# Patient Record
Sex: Male | Born: 1945 | Hispanic: No | Marital: Married | Smoking: Never smoker
Health system: Southern US, Community
[De-identification: ages and names within clinical notes are randomized; demographics above are authoritative.]

## PROBLEM LIST (undated history)

## (undated) DIAGNOSIS — E785 Hyperlipidemia, unspecified: Secondary | ICD-10-CM

## (undated) DIAGNOSIS — I639 Cerebral infarction, unspecified: Secondary | ICD-10-CM

## (undated) HISTORY — DX: Cerebral infarction, unspecified: I63.9

## (undated) HISTORY — DX: Hyperlipidemia, unspecified: E78.5

---

## 1999-09-07 ENCOUNTER — Emergency Department (HOSPITAL_COMMUNITY): Admission: EM | Admit: 1999-09-07 | Discharge: 1999-09-07 | Payer: Self-pay

## 2007-05-25 ENCOUNTER — Inpatient Hospital Stay (HOSPITAL_COMMUNITY): Admission: EM | Admit: 2007-05-25 | Discharge: 2007-05-30 | Payer: Self-pay | Admitting: Emergency Medicine

## 2007-05-25 ENCOUNTER — Encounter (INDEPENDENT_AMBULATORY_CARE_PROVIDER_SITE_OTHER): Payer: Self-pay | Admitting: Internal Medicine

## 2007-05-27 ENCOUNTER — Encounter (INDEPENDENT_AMBULATORY_CARE_PROVIDER_SITE_OTHER): Payer: Self-pay | Admitting: Internal Medicine

## 2007-05-28 ENCOUNTER — Encounter (INDEPENDENT_AMBULATORY_CARE_PROVIDER_SITE_OTHER): Payer: Self-pay | Admitting: Internal Medicine

## 2007-05-28 ENCOUNTER — Ambulatory Visit: Payer: Self-pay | Admitting: Physical Medicine & Rehabilitation

## 2007-05-30 ENCOUNTER — Inpatient Hospital Stay (HOSPITAL_COMMUNITY)
Admission: RE | Admit: 2007-05-30 | Discharge: 2007-06-12 | Payer: Self-pay | Admitting: Physical Medicine & Rehabilitation

## 2007-07-11 ENCOUNTER — Encounter
Admission: RE | Admit: 2007-07-11 | Discharge: 2007-07-11 | Payer: Self-pay | Admitting: Physical Medicine & Rehabilitation

## 2007-11-05 ENCOUNTER — Encounter
Admission: RE | Admit: 2007-11-05 | Discharge: 2008-01-13 | Payer: Self-pay | Admitting: Physical Medicine & Rehabilitation

## 2007-11-05 ENCOUNTER — Ambulatory Visit: Payer: Self-pay | Admitting: Physical Medicine & Rehabilitation

## 2007-12-31 ENCOUNTER — Ambulatory Visit: Payer: Self-pay | Admitting: Physical Medicine & Rehabilitation

## 2008-01-01 ENCOUNTER — Encounter
Admission: RE | Admit: 2008-01-01 | Discharge: 2008-03-31 | Payer: Self-pay | Admitting: Physical Medicine & Rehabilitation

## 2008-02-26 ENCOUNTER — Ambulatory Visit: Payer: Self-pay | Admitting: Physical Medicine & Rehabilitation

## 2008-05-06 ENCOUNTER — Encounter: Admission: RE | Admit: 2008-05-06 | Discharge: 2008-06-11 | Payer: Self-pay | Admitting: Neurology

## 2008-05-08 ENCOUNTER — Ambulatory Visit (HOSPITAL_COMMUNITY): Admission: RE | Admit: 2008-05-08 | Discharge: 2008-05-08 | Payer: Self-pay | Admitting: Neurology

## 2008-05-26 ENCOUNTER — Encounter
Admission: RE | Admit: 2008-05-26 | Discharge: 2008-06-18 | Payer: Self-pay | Admitting: Physical Medicine & Rehabilitation

## 2008-05-27 ENCOUNTER — Ambulatory Visit: Payer: Self-pay | Admitting: Physical Medicine & Rehabilitation

## 2008-08-24 ENCOUNTER — Encounter
Admission: RE | Admit: 2008-08-24 | Discharge: 2008-11-22 | Payer: Self-pay | Admitting: Physical Medicine & Rehabilitation

## 2008-08-26 ENCOUNTER — Ambulatory Visit: Payer: Self-pay | Admitting: Physical Medicine & Rehabilitation

## 2008-10-19 ENCOUNTER — Ambulatory Visit: Payer: Self-pay | Admitting: Physical Medicine & Rehabilitation

## 2009-03-09 ENCOUNTER — Inpatient Hospital Stay (HOSPITAL_COMMUNITY): Admission: EM | Admit: 2009-03-09 | Discharge: 2009-03-11 | Payer: Self-pay | Admitting: Emergency Medicine

## 2009-03-23 ENCOUNTER — Ambulatory Visit (HOSPITAL_COMMUNITY): Admission: RE | Admit: 2009-03-23 | Discharge: 2009-03-23 | Payer: Self-pay | Admitting: Family Medicine

## 2009-04-12 ENCOUNTER — Encounter
Admission: RE | Admit: 2009-04-12 | Discharge: 2009-06-11 | Payer: Self-pay | Admitting: Physical Medicine & Rehabilitation

## 2009-04-12 ENCOUNTER — Ambulatory Visit: Payer: Self-pay | Admitting: Physical Medicine & Rehabilitation

## 2009-06-11 ENCOUNTER — Ambulatory Visit: Payer: Self-pay | Admitting: Physical Medicine & Rehabilitation

## 2009-07-09 IMAGING — CT CT HEAD W/O CM
1 series · 15 of 30 positions shown, 19 images · non-contrast
Comparison: none

HISTORY: Altered level of consciousness

[Series 2: head routine 4.8 h47s · axial · 0.46mm/px · z∈[-208,-56]mm · 15 of 36 slices shown, 19 images]
[im 2/36  brain]
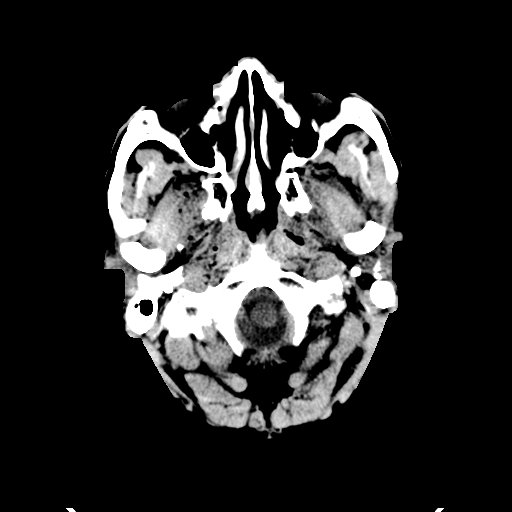
[im 2/36  bone]
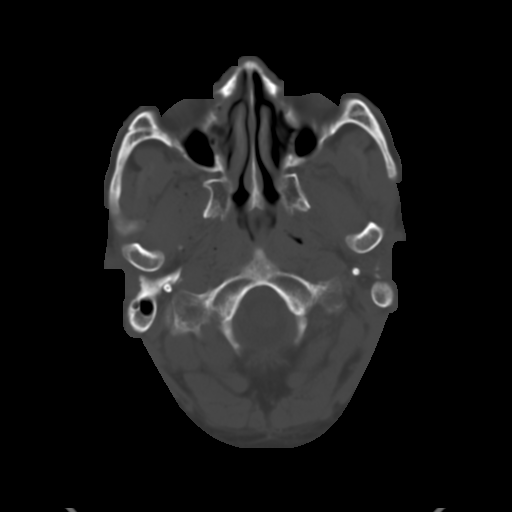
[im 4/36  brain]
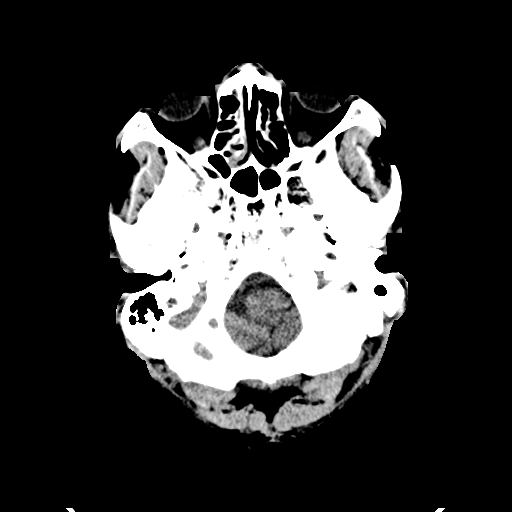
[im 7/36  brain]
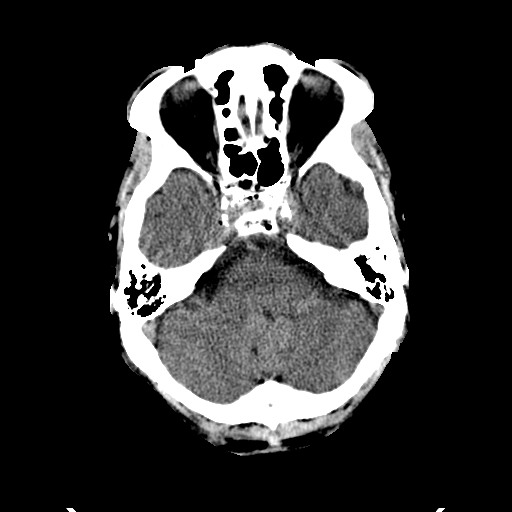
[im 9/36  brain]
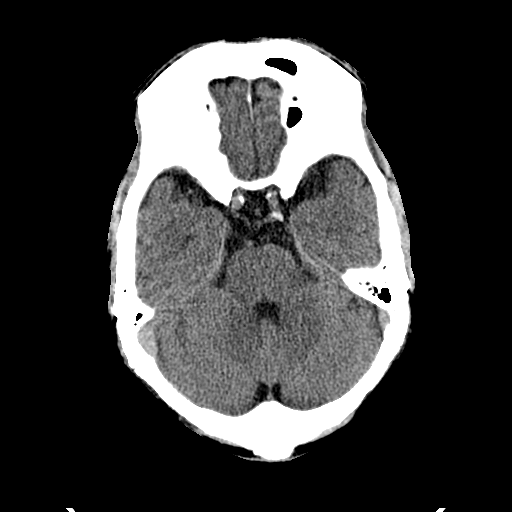
[im 11/36  brain]
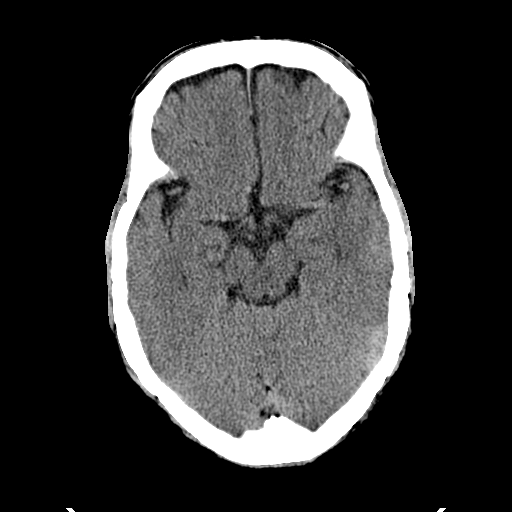
[im 11/36  bone]
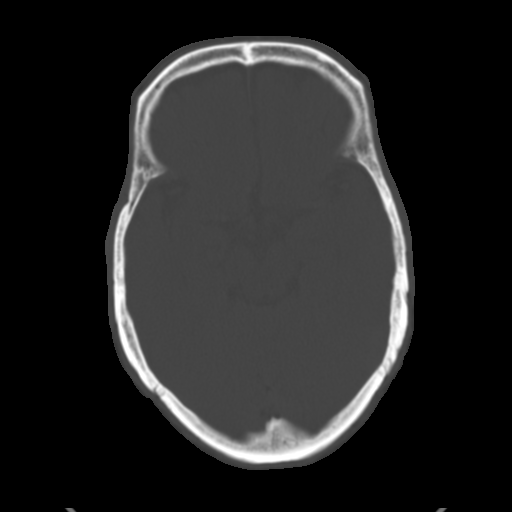
[im 14/36  brain]
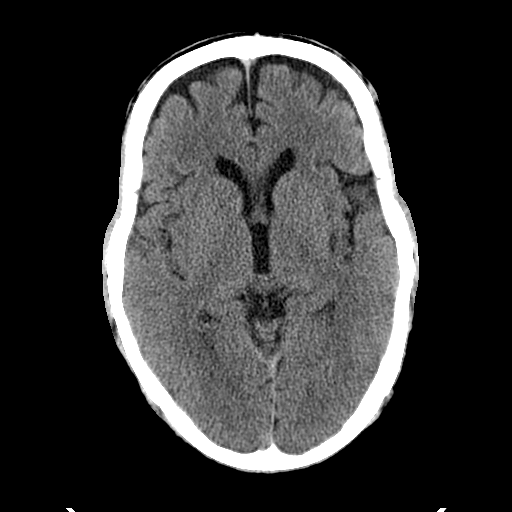
[im 16/36  brain]
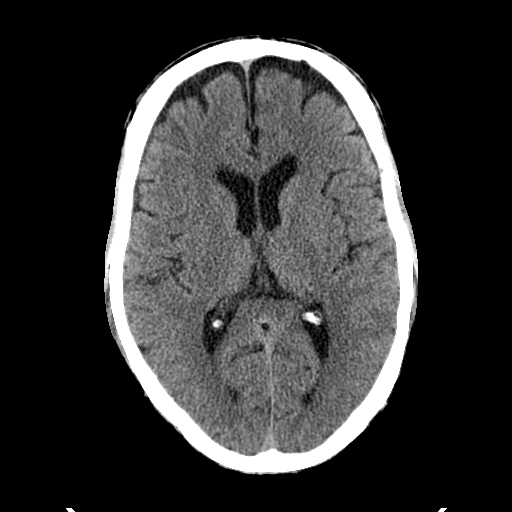
[im 19/36  brain]
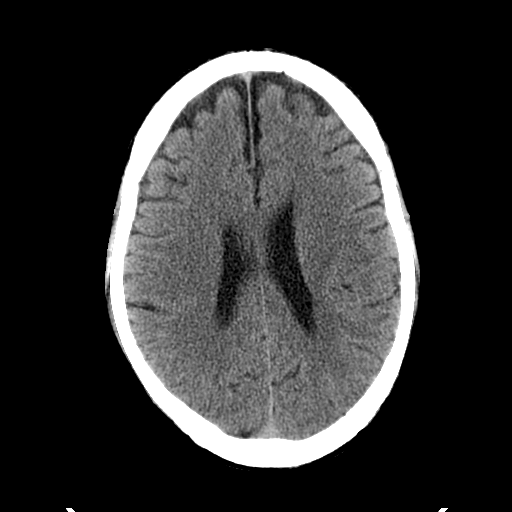
[im 20/36  brain]
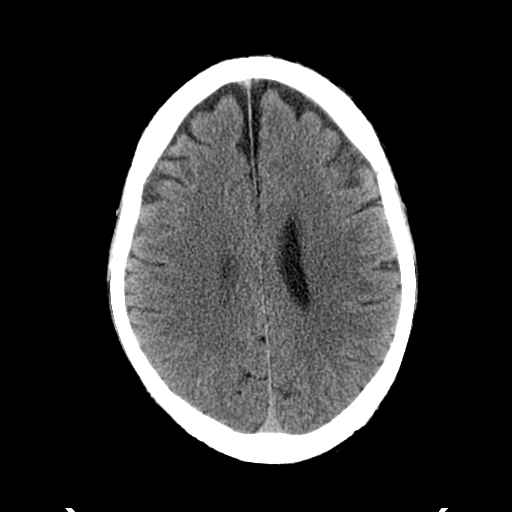
[im 20/36  bone]
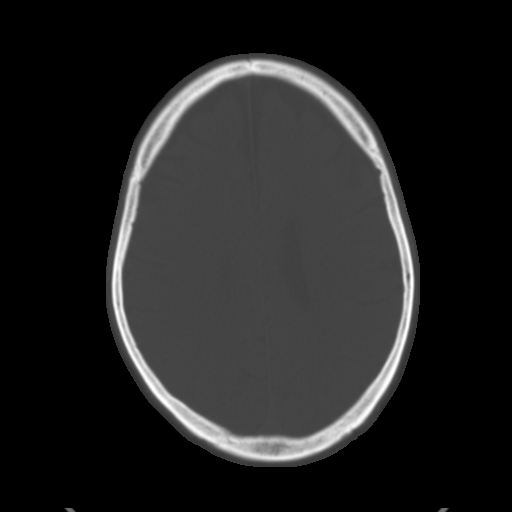
[im 22/36  brain]
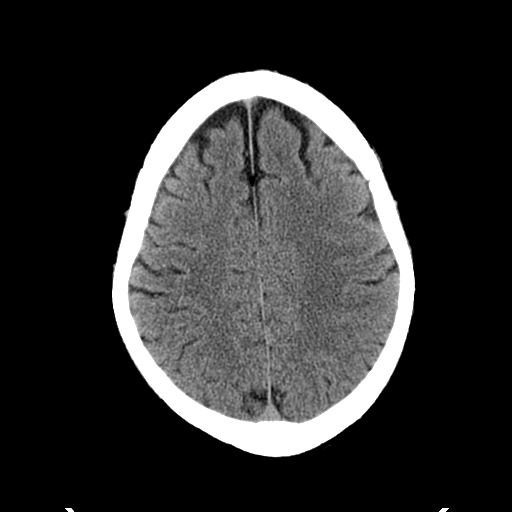
[im 25/36  brain]
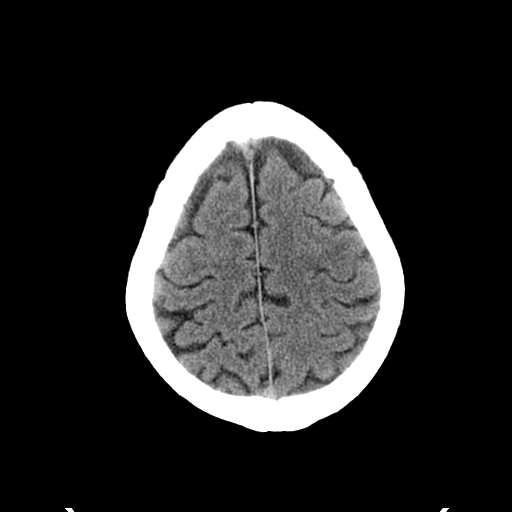
[im 27/36  brain]
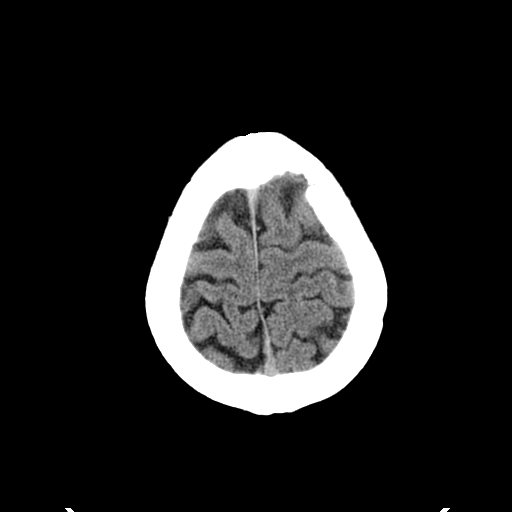
[im 29/36  brain]
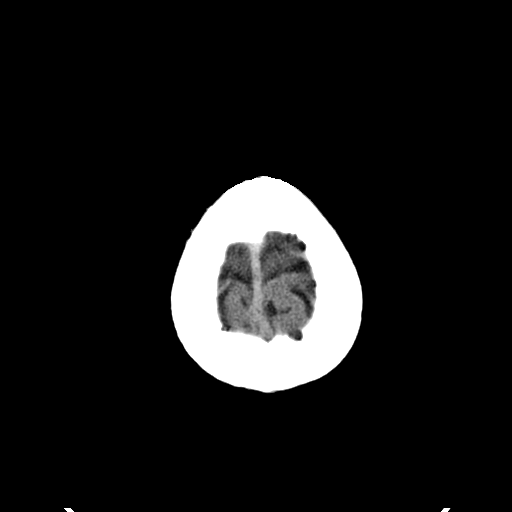
[im 29/36  bone]
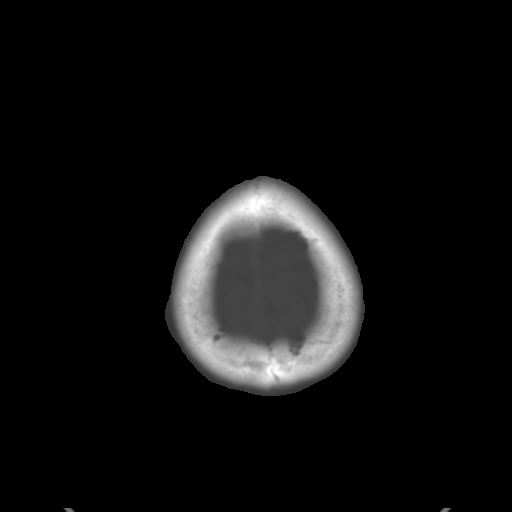
[im 32/36  brain]
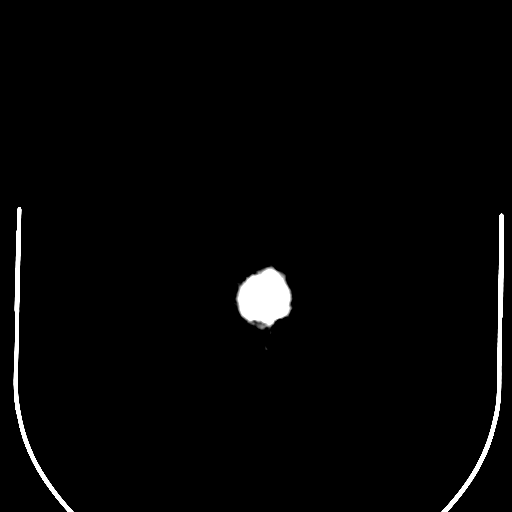
[im 34/36  brain]
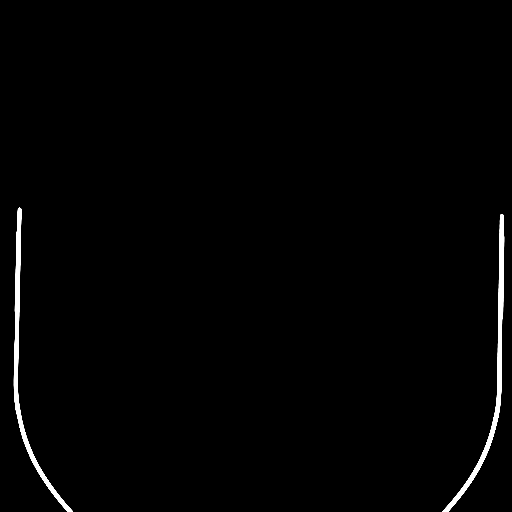

[15 of 30 positions shown; findings below may reference images not displayed]

CT HEAD WITHOUT CONTRAST:

Routine noncontrast CT head without priors for comparison.

Mild generalized atrophy.
Normal ventricular morphology.
No midline shift or mass-effect.
Otherwise normal appearance of brain parenchyma.
No intracranial hemorrhage, mass, or infarct.
Mucosal thickening throughout ethmoid air cells.

On bone window images [DATE], tiny foci of gas are seen at the skull base at the
sella turcica and parasellar regions.
These are of uncertain etiology and significance.
Adjacent sphenoid sinus is clear and no calvarial fracture is seen.
These could represent foci of gas within bone or calcification.
No CT evidence of trauma/fracture or penetrating infection/sinus disease seen.
Mastoid air cells and middle ear cavities appear clear.
IMPRESSION: Mild generalized atrophy without mass or hemorrhage.
Tiny foci of gas at skullbase at sella and parasellar regions, of uncertain
significance.
I do not see evidence of a calvarial or skull base fracture to account for gas
nor evidence of significant or penetrating sinus disease.
Correlation with patient's history recommended.
If patient's symptoms continue recommend MRI brain with and without contrast to
further assess.

## 2009-12-01 ENCOUNTER — Encounter
Admission: RE | Admit: 2009-12-01 | Discharge: 2010-03-01 | Payer: Self-pay | Admitting: Physical Medicine & Rehabilitation

## 2010-01-03 ENCOUNTER — Ambulatory Visit: Payer: Self-pay | Admitting: Physical Medicine & Rehabilitation

## 2010-04-25 ENCOUNTER — Encounter
Admission: RE | Admit: 2010-04-25 | Discharge: 2010-05-03 | Payer: Self-pay | Admitting: Physical Medicine & Rehabilitation

## 2010-05-03 ENCOUNTER — Ambulatory Visit: Payer: Self-pay | Admitting: Physical Medicine & Rehabilitation

## 2010-09-02 ENCOUNTER — Encounter
Admission: RE | Admit: 2010-09-02 | Discharge: 2010-09-07 | Payer: Self-pay | Source: Home / Self Care | Attending: Physical Medicine & Rehabilitation | Admitting: Physical Medicine & Rehabilitation

## 2010-09-07 ENCOUNTER — Ambulatory Visit: Payer: Self-pay | Admitting: Physical Medicine & Rehabilitation

## 2010-11-21 ENCOUNTER — Encounter: Payer: Self-pay | Admitting: Neurology

## 2010-12-26 ENCOUNTER — Ambulatory Visit: Payer: Medicare Other | Admitting: Physical Medicine & Rehabilitation

## 2010-12-26 ENCOUNTER — Encounter: Payer: Medicare Other | Attending: Physical Medicine & Rehabilitation

## 2010-12-26 DIAGNOSIS — M7530 Calcific tendinitis of unspecified shoulder: Secondary | ICD-10-CM

## 2010-12-26 DIAGNOSIS — I10 Essential (primary) hypertension: Secondary | ICD-10-CM

## 2010-12-26 DIAGNOSIS — IMO0002 Reserved for concepts with insufficient information to code with codable children: Secondary | ICD-10-CM

## 2010-12-26 DIAGNOSIS — M7500 Adhesive capsulitis of unspecified shoulder: Secondary | ICD-10-CM | POA: Insufficient documentation

## 2010-12-26 DIAGNOSIS — R109 Unspecified abdominal pain: Secondary | ICD-10-CM | POA: Insufficient documentation

## 2010-12-26 DIAGNOSIS — M549 Dorsalgia, unspecified: Secondary | ICD-10-CM | POA: Insufficient documentation

## 2010-12-26 DIAGNOSIS — I6330 Cerebral infarction due to thrombosis of unspecified cerebral artery: Secondary | ICD-10-CM

## 2010-12-26 DIAGNOSIS — M25539 Pain in unspecified wrist: Secondary | ICD-10-CM | POA: Insufficient documentation

## 2010-12-26 DIAGNOSIS — I69998 Other sequelae following unspecified cerebrovascular disease: Secondary | ICD-10-CM | POA: Insufficient documentation

## 2010-12-26 DIAGNOSIS — R209 Unspecified disturbances of skin sensation: Secondary | ICD-10-CM | POA: Insufficient documentation

## 2011-01-02 ENCOUNTER — Emergency Department (HOSPITAL_COMMUNITY)
Admission: EM | Admit: 2011-01-02 | Discharge: 2011-01-02 | Disposition: A | Discharge status: Home / Self Care | Payer: Medicare Other | Attending: Emergency Medicine | Admitting: Emergency Medicine

## 2011-01-02 ENCOUNTER — Emergency Department (HOSPITAL_COMMUNITY): Payer: Medicare Other

## 2011-01-02 DIAGNOSIS — J449 Chronic obstructive pulmonary disease, unspecified: Secondary | ICD-10-CM | POA: Insufficient documentation

## 2011-01-02 DIAGNOSIS — R109 Unspecified abdominal pain: Secondary | ICD-10-CM | POA: Insufficient documentation

## 2011-01-02 DIAGNOSIS — J9819 Other pulmonary collapse: Secondary | ICD-10-CM | POA: Insufficient documentation

## 2011-01-02 DIAGNOSIS — R110 Nausea: Secondary | ICD-10-CM | POA: Insufficient documentation

## 2011-01-02 DIAGNOSIS — J4489 Other specified chronic obstructive pulmonary disease: Secondary | ICD-10-CM | POA: Insufficient documentation

## 2011-01-02 LAB — DIFFERENTIAL
Basophils Absolute: 0.1 10*3/uL (ref 0.0–0.1)
Basophils Relative: 1 % (ref 0–1)
Eosinophils Absolute: 0.4 10*3/uL (ref 0.0–0.7)
Monocytes Relative: 12 % (ref 3–12)
Neutro Abs: 6.4 10*3/uL (ref 1.7–7.7)
Neutrophils Relative %: 60 % (ref 43–77)

## 2011-01-02 LAB — CBC
MCH: 28 pg (ref 26.0–34.0)
Platelets: 203 10*3/uL (ref 150–400)
RBC: 6.26 MIL/uL — ABNORMAL HIGH (ref 4.22–5.81)

## 2011-01-02 LAB — COMPREHENSIVE METABOLIC PANEL
ALT: 20 U/L (ref 0–53)
AST: 32 U/L (ref 0–37)
Albumin: 3.6 g/dL (ref 3.5–5.2)
Calcium: 9.1 mg/dL (ref 8.4–10.5)
Chloride: 106 mEq/L (ref 96–112)
Creatinine, Ser: 0.83 mg/dL (ref 0.4–1.5)
GFR calc Af Amer: >60 mL/min (ref >60)
Sodium: 137 mEq/L (ref 135–145)
Total Bilirubin: 0.5 mg/dL (ref 0.3–1.2)

## 2011-01-02 LAB — URINALYSIS, ROUTINE W REFLEX MICROSCOPIC
Bilirubin Urine: NEGATIVE
Specific Gravity, Urine: 1.019 (ref 1.005–1.030)
pH: 6 (ref 5.0–8.0)

## 2011-01-02 LAB — URINE MICROSCOPIC-ADD ON

## 2011-02-07 LAB — BASIC METABOLIC PANEL
BUN: 13 mg/dL (ref 6–23)
BUN: 8 mg/dL (ref 6–23)
BUN: 9 mg/dL (ref 6–23)
Calcium: 8.4 mg/dL (ref 8.4–10.5)
Calcium: 9 mg/dL (ref 8.4–10.5)
Creatinine, Ser: 0.86 mg/dL (ref 0.4–1.5)
Creatinine, Ser: 1.01 mg/dL (ref 0.4–1.5)
GFR calc non Af Amer: >60 mL/min (ref >60)
GFR calc non Af Amer: >60 mL/min (ref >60)
GFR calc non Af Amer: >60 mL/min (ref >60)
Glucose, Bld: 129 mg/dL — ABNORMAL HIGH (ref 70–99)
Glucose, Bld: 158 mg/dL — ABNORMAL HIGH (ref 70–99)
Potassium: 4.3 mEq/L (ref 3.5–5.1)
Sodium: 140 mEq/L (ref 135–145)

## 2011-02-07 LAB — POCT CARDIAC MARKERS
CKMB, poc: 3.6 ng/mL (ref 1.0–8.0)
CKMB, poc: 3.7 ng/mL (ref 1.0–8.0)
Troponin i, poc: <0.05 ng/mL (ref 0.00–0.09)

## 2011-02-07 LAB — DIFFERENTIAL
Basophils Relative: 0 % (ref 0–1)
Eosinophils Absolute: 0.4 10*3/uL (ref 0.0–0.7)
Eosinophils Absolute: 0.8 10*3/uL — ABNORMAL HIGH (ref 0.0–0.7)
Eosinophils Relative: 4 % (ref 0–5)
Lymphocytes Relative: 28 % (ref 12–46)
Lymphs Abs: 2.5 10*3/uL (ref 0.7–4.0)
Lymphs Abs: 3.8 10*3/uL (ref 0.7–4.0)
Neutrophils Relative %: 56 % (ref 43–77)
Neutrophils Relative %: 70 % (ref 43–77)

## 2011-02-07 LAB — CBC
HCT: 46.4 % (ref 39.0–52.0)
HCT: 46.5 % (ref 39.0–52.0)
Hemoglobin: 14.9 g/dL (ref 13.0–17.0)
Hemoglobin: 15.7 g/dL (ref 13.0–17.0)
MCV: 84.3 fL (ref 78.0–100.0)
MCV: 85.1 fL (ref 78.0–100.0)
Platelets: 188 10*3/uL (ref 150–400)
Platelets: 191 10*3/uL (ref 150–400)
Platelets: 206 10*3/uL (ref 150–400)
Platelets: 226 10*3/uL (ref 150–400)
RBC: 5.25 MIL/uL (ref 4.22–5.81)
RBC: 5.52 MIL/uL (ref 4.22–5.81)
RDW: 16.1 % — ABNORMAL HIGH (ref 11.5–15.5)
RDW: 16.4 % — ABNORMAL HIGH (ref 11.5–15.5)
WBC: 10.8 10*3/uL — ABNORMAL HIGH (ref 4.0–10.5)
WBC: 13.4 10*3/uL — ABNORMAL HIGH (ref 4.0–10.5)

## 2011-02-07 LAB — BRAIN NATRIURETIC PEPTIDE: Pro B Natriuretic peptide (BNP): <30 pg/mL (ref 0.0–100.0)

## 2011-02-07 LAB — HEMOGLOBIN A1C: Hgb A1c MFr Bld: 5.9 % (ref 4.6–6.1)

## 2011-02-07 LAB — CULTURE, BLOOD (ROUTINE X 2): Culture: NO GROWTH

## 2011-02-07 LAB — D-DIMER, QUANTITATIVE: D-Dimer, Quant: 0.72 ug/mL-FEU — ABNORMAL HIGH (ref 0.00–0.48)

## 2011-02-07 LAB — COMPREHENSIVE METABOLIC PANEL
BUN: 5 mg/dL — ABNORMAL LOW (ref 6–23)
CO2: 25 mEq/L (ref 19–32)
Chloride: 109 mEq/L (ref 96–112)
Creatinine, Ser: 0.84 mg/dL (ref 0.4–1.5)
GFR calc non Af Amer: >60 mL/min (ref >60)
Glucose, Bld: 109 mg/dL — ABNORMAL HIGH (ref 70–99)
Total Bilirubin: 0.5 mg/dL (ref 0.3–1.2)

## 2011-02-07 LAB — MAGNESIUM: Magnesium: 2.4 mg/dL (ref 1.5–2.5)

## 2011-03-14 NOTE — H&P (Signed)
NAMESHERRELL, FARISH NO.:  [id number]   MEDICAL RECORD NO.:  [id number]          PATIENT TYPE:  IPS   LOCATION:  NA                           FACILITY:  [facility]   PHYSICIAN:  Ellwood Dense, M.D.   DATE OF BIRTH:  January 02, 1946   DATE OF ADMISSION:  DATE OF DISCHARGE:                              HISTORY & PHYSICAL   PRIMARY CARE PHYSICIAN:  [facility].   NEUROLOGISTS:  Pramod P. Pearlean Brownie, MD., and Hospitalist D Team.   HISTORY OF PRESENT ILLNESS:  Mr. Spickler is a 65 year old right-handed  [place] male admitted May 25, 2007, with altered speech along with  balance problems and visual changes.  MRI and MR study of the brain  showed an acute left posterior cerebral artery infarct with questionable  thrombus in the distal middle cerebral artery branch and high grade  stenosis or occlusion in the posterior left middle cerebral artery.  Carotid Doppler showed no internal carotid artery stenosis.  Transesophageal echocardiogram showed injection of 60% with a patent  foramen ovale with right-to-left shunt, bilateral lower extremity  Dopplers were negative for DVT.   The patient is experiencing expressive more than receptive aphasia.  His  verbal output is improving.  He has decreased sensation of the right  side and is ataxic.  PT and OT are noting this patient requires total  assist to keep the right leg blocked and the right leg positioned.  He  has ambulated 3-16 feet overall.  He requires minimal to max assist for  ADLs with fluctuating ability to follow commands secondary to apraxia.  He has problems with [place] as well as [place] language at present.   The patient was evaluated by the rehabilitation physicians and felt to  be an appropriate candidate for inpatient rehabilitation.   ALLERGIES:  NO KNOWN DRUG ALLERGIES.   MEDICATIONS PRIOR TO ADMISSION:  None.   REVIEW OF SYSTEMS:  Noncontributory.   PAST MEDICAL HISTORY:  Negative for prior illness or  surgery.   FAMILY HISTORY:  Negative.   SOCIAL HISTORY:  The patient is married and works in Set designer.  He  and his wife live in a one level home with three steps to enter.  He  quit tobacco six months ago.  He reports occasional alcohol usage,  specifically beer.  His family is supportive and his wife can provide  supervision at discharge.  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   FUNCTIONAL HISTORY PRIOR TO ADMISSION:  Independent.   LABORATORY DATA:  Recent hemoglobin was 14.9 with hematocrit of 44.8,  platelet count 207,000, white count 7.6.  Homocystine level was 13.6  with TSH of 1.765.  Hemoglobin A1c was 5.7.  Urine drug screen was  nondetectable for toxins.   Chest x-ray May 25, 2007, showed minimal bronchiectatic changes along  with right basilar atelectasis.  Recent sodium was 138, potassium 4.1,  chloride 111, CO2 24, BUN 10, creatinine 0.72 and glucose 101.   PHYSICAL EXAMINATION:  VITAL SIGNS:  Blood pressure 122/71 with pulse  55, respiratory rate 18, and temperature 98.3.  GENERAL APPEARANCE:  A well-appearing, thin middle-aged adult male lying  in bed in no acute discomfort.  HEENT:  Normocephalic and atraumatic.  LUNGS:  Clear to auscultation bilaterally.  CARDIOVASCULAR:  Regular rate and rhythm.  S1 and S2 without murmurs.  ABDOMEN:  Soft, nontender with positive bowel sounds.  NEUROLOGIC:  Alert and oriented to self.  Social language is intact but  he is unable to state his name or identify items.  He follows one step  commands with some cueing.  Bilateral upper extremity exam showed 4-5  strength throughout.  Bulk and tone were normal.  Reflexes were 2+ and  symmetric.  LOWER EXTREMITY:  Exam showed 4/5 strength in hip flexion, knee  extension and ankle dorsiflexion.   IMPRESSION:  1. 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  2. Recent discovery of patent foramen ovale  on aspirin therapy with      follow-up treatment to be recommended.   Presently, the patient has deficits in activities of daily living,  transfers, ambulation and speech and higher level cognition related to  the above noted posterior cerebral artery infarct.   PLAN:  Admit to the rehabilitation unit for daily therapy to include  1. Physical therapy for range of motion, strengthening, bed mobility,      transfers, pregait training, gait training and equipment      evaluation.  2. Occupational therapy for range of motion, strengthening, ADLs,      cognitive/perceptual training, splinting and equipment evaluation.  3. Rehab nursing for skin care, wound care and bowel and bladder      training as necessary.  4. Speech therapy for oromotor exercises, communication aids and      aphasia with treatment as necessary.  5. Case management to assess home environment, assist with discharge      planning and arrange for appropriate follow-up care.  6. Social worker to assess family and social support and assist in      discharge planning along with counsel patient regarding disability      issues.  7. Continue enteric coated aspirin 325 mg p.o. daily.  8. Lovenox 40 mg subcu daily for DVT prophylaxis.  9. Protonix 40 mg p.o. daily.  10.Regular diet.  11.Check CBC along with CMET and lipid panel in the a.m. May 31, 2007.  12.Ground pass with staff and family by wheelchair.  13.Case management to assess setting up patient with primary care      physician at discharge.  14.Check urinalysis and urine culture.   PROGNOSIS:  Good.   ESTIMATED LENGTH OF STAY:  Five to 10 days.   GOALS:  Modified independent, ADLs, transfers and ambulation.           ______________________________  Ellwood Dense, M.D.     DC/MEDQ  D:  05/30/2007  T:  05/30/2007  Job:  [id number]

## 2011-03-14 NOTE — Assessment & Plan Note (Signed)
HISTORY:  [name] is back regarding a left PCA stroke.  He was  discharged from rehab in August.  He is back today for followup.  His  family member that was with him today stated that he was unable to get  transportation to the office unfortunately.  He has received home health  therapies and has completed these over the last month or so.  His main  issue today is pain in the right arm and leg which he calls stabbing.  He states that his sensation is not the same, right to left.  He is  moving the arm and leg pretty well.  He walks with a straight cane for  balance.  He has had no falls.  He has not seen neurology as of yet for  followup as was recommended, although he did see his family physician,  Dr. Wynelle Link.  The patient remains on aspirin and simvastatin for stroke  maintenance.   REVIEW OF SYSTEMS:  Pertinent and positives as listed above and full  review of written health and history in the chart.   SOCIAL HISTORY:  The patient is married with his wife today.   PHYSICAL EXAMINATION:  VITAL SIGNS:  Blood pressure 162/98, pulse 21,  respiratory rate 18, satting 94% on room air.  GENERAL:  The patient is alert and pleasant.  He is able to follow  commands.  He does speak minimal English and was communicating through  his family member today.  The patient walks with some antalgia to the  right.  He does bear weight well overall, however, it is a bit choppy  though with his movement.  He was not at a risk for falling.  He walked  without his cane today.  EXTREMITIES:  Strength in the right leg was 4+ to 5/5.  Right upper  extremity was 3+ to 4/5.  He is a bit tight in the right shoulder with  abduction and in rotator cuff maneuvers.  They seem to provoke some  pain.  I saw some under-riding pectoralis major tightness in his  shoulder today.  Otherwise, tone was trace out of 4.  Sensation was 1/2  on the right and 2/2 on the left.  NEUROLOGIC:  Cognition appeared to be appropriate,  although there was  some language there.  HEART:  Regular.  CHEST:  Clear.  ABDOMEN:  Soft, nontender.   ASSESSMENT:  1. Left posterior cerebral artery infarct.  2. Dyslipidemia.  3. Patent foramen ovale with right to left shunt.   PLAN:  1. The patient needs follow up with Dr. Pearlean Brownie for plan regarding the      PFO.  2. We will start a trial of Neurontin for neuropathic pain 300 mg      q.h.s. titrating up to t.i.d. over 2 weeks' time.  3. Refilled simvastatin today.  4. The patient will continue with aspirin for stroke prophylaxis.  5. Recommended blood pressure followup as his numbers were high today.  6. I will see him back in 2 months' time.      Ranelle Oyster, M.D.  Electronically Signed     ZTS/MedQ  D:  11/06/2007 11:47:51  T:  11/06/2007 12:58:55  Job #:  [id number]   cc:   Pramod P. Pearlean Brownie, MD  Fax: [phone]   Deatra James, M.D.  Fax: [phone]

## 2011-03-14 NOTE — H&P (Signed)
Kyle [name], SPREEN NO.:  [id number]   MEDICAL RECORD NO.:  [id number]          PATIENT TYPE:  INP   LOCATION:  3005                         FACILITY:  [facility]   PHYSICIAN:  [name], M.D. DATE OF BIRTH:  1946/05/29   DATE OF ADMISSION:  05/25/2007  DATE OF DISCHARGE:                              HISTORY & PHYSICAL   PRIMARY CARE PHYSICIAN:  Unassigned.   CHIEF COMPLAINT:  Increased confusion.   HISTORY OF PRESENT ILLNESS:  This is a 65 year old [place]-speaking  mainly male presenting to the emergency department secondary to reports  from his family of increased confusion and acting out of character.  The  patient's son translates and reports that his father had transient loss  of vision while driving as well.  His father has been complaining of  having headaches for a while.  There has been no observance of nausea,  vomiting, diarrhea or complaints of chest pain.   PAST MEDICAL HISTORY:  None.   PAST SURGICAL HISTORY:  None.   MEDICATIONS:  None.   ALLERGIES:  NO KNOWN DRUG ALLERGIES.   SOCIAL HISTORY:  Nonsmoker, nondrinker.   FAMILY HISTORY:  Noncontributory.   REVIEW OF SYSTEMS:  Pertinents are mentioned above.   PHYSICAL EXAMINATION FINDINGS:  GENERAL:  This is a 65 year old well-  nourished, well-developed male in no acute distress currently.  VITAL SIGNS:  Temperature 97.1, blood pressure 129/69, heart rate 56,  respirations 24, O2 saturation 96%.  HEENT:  Normocephalic, atraumatic.  Pupils are equally round, reactive  to light.  Extraocular muscles are intact.  Funduscopic benign.  Oropharynx is clear.  Neck is supple, full range of motion.  No  thyromegaly, adenopathy or jugular venous distension.  CARDIOVASCULAR:  Mild bradycardia, regular rate.  No murmurs, gallops or  rubs.  LUNGS:  Clear to auscultation bilaterally.  ABDOMEN:  Positive bowel sounds, soft, nontender, nondistended.  EXTREMITIES:  Without cyanosis, clubbing or  edema.  NEUROLOGIC EXAMINATION:  Alert and oriented times 3.  Cranial nerves are  intact at this time.  There are no deficits on examination.  Motor and  sensory function are intact.  Strength is also 5/5 throughout and  symmetric.  He has full range of motion.  Cerebellar function also  intact.  Reflexes 2/4+ and symmetric.   LABORATORY STUDIES:  White blood cell count 7.9, hemoglobin 14.9,  hematocrit 44.8, MCV 82.7, platelets 209, neutrophils 48%, lymphocytes  32%.  Sodium 138, potassium 4.1, chloride 111, carbon dioxide 24, BUN  10, creatinine 0.72, glucose 101.  Alcohol level less than 5.  Cardiac  enzymes - myoglobin 90.2, CK-MB 6.6, troponin less than 0.05.  EKG  reveals a sinus bradycardia.  There are no ST-segment changes.   ASSESSMENT:  A 65 year old male being admitted with  1. Altered mental status.  2. Transient ischemic attacks.  3. Cephalgia.  4. Sinus bradycardia.   PLAN:  The patient will be admitted to a telemetry area for cardiac  monitoring.  Cardiac enzymes will be performed q.8 h. times 3.  The  patient will be placed on neurologic checks  to monitor for further  neurologic changes.  An MRI/MRA study has been ordered along with a  carotid ultrasound study and a 2D echo.  The patient will be placed on  DVT and GI prophylaxis.      [name], M.D.  Electronically Signed     HJ/MEDQ  D:  05/25/2007  T:  05/26/2007  Job:  [id number]

## 2011-03-14 NOTE — Assessment & Plan Note (Signed)
Mr. Dirr is back regarding his left PCA stroke and right shoulder pain.  Shoulder is still doing well after October injection.  He has been  having some cramping in his right hand and foot apparently over the last  12 hours, but not before.  He has been tolerating the Neurontin at  correct dosing, taking 100 mg t.i.d.  I do not believe he ever increased  the dose we had recommended.  He has been doing better with his blood  pressure control apparently.  Overall, he is really happy with his  progress.  He rates his pain 5/10, describes as burning.  Pain  interferes with his general activity, relationship with others, and  enjoyment of life on a moderate level.   REVIEW OF SYSTEMS:  Notable for trouble walking.  Other pertinent  positives are above and full review is in the written health and history  section.   SOCIAL HISTORY:  The patient is married and living with his wife.  He  has a church member with him today.   PHYSICAL EXAMINATION:  VITAL SIGNS:  Blood pressure is 132/88, pulse is  72, respiratory rate 18, and he is sating 95% on room air.  GENERAL:  The patient is pleasant, alert, and oriented x3.  He walked  with better knee control than I have seen him before.  He still has  occasional hyperextension movement at the right knee.  MUSCULOSKELETAL:  Gait is overall stable, but wide based.  Shoulder  movement is much improved with less impingement signs and pain with  abduction and rotation today.  Strength is 4+/5 in the upper extremity  and lower extremities.  Sensory exam is grossly intact.  He had some  pain with deep palpation of the right lower extremity first, second, and  third toes.  No swelling, discoloration, etc was noted.  He was  clinically allodynic or sensitive to touch.  Cognitively, he was  appropriate.  He continues to have language barrier in our  conversations.  HEART:  Regular.  CHEST:  Clear.  ABDOMEN:  Soft and nontender.   ASSESSMENT:  1. Left  posterior cerebral artery infarct.  2. Adhesive capsulitis of right shoulder.  3. Dyslipidemia and hypertension.   PLAN:  1. We will try to wean off Neurontin again over the next 2 weeks'      time.  2. Discussed his right hand and foot.  Recommended Tylenol for now.      It appears that he may be having some mild arthritis in his joints.      It is really well defined at this point and considering the fact he      has only had this for 12 hours or so, would not make too much of it      at this time.  3. I will see him back in about 6 months.  He can come back sooner if      needed.      Ranelle Oyster, M.D.  Electronically Signed     ZTS/MedQ  D:  10/19/2008 12:42:06  T:  10/20/2008 03:23:58  Job #:  [id number]

## 2011-03-14 NOTE — Cardiovascular Report (Signed)
NAMEDELL, BRINER NO.:  [id number]   MEDICAL RECORD NO.:  [id number]          PATIENT TYPE:  INP   LOCATION:  3005                         FACILITY:  [facility]   PHYSICIAN:  Darlin Priestly, MD  DATE OF BIRTH:  [birth year]   DATE OF PROCEDURE:  05/27/2007  DATE OF DISCHARGE:                            CARDIAC CATHETERIZATION   PROCEDURE:  Transesophageal echocardiogram.   ATTENDING:  Darlin Priestly, MD.   COMPLICATIONS:  None.   [name] is a 65 year old male from [place] with no significant past  medical history who was admitted on May 25, 2007, with confusion and  found to have acute left MCA infarct.  He is now referred for  transesophageal echocardiogram to rule out possible neurocardiogenic  etiology.   DESCRIPTION OF PROCEDURE:  After giving informed written consent, the  patient was brought to the endoscopy suite in the fasting state.  He  underwent successful and uncomplicated transesophageal echocardiogram.   There was a mild left ventricular hypertrophy with normal LV systolic  function, estimated EF approximately 60%.   Mildly thickened aortic valve with trivial aortic insufficiency.   Mildly thickened mitral valve leaflets with trivial to mild mitral  regurgitation.   Structure of the tricuspid revealed trivial tricuspid regurgitation.   No evidence of intracardiac mass or thrombus noted.   There is a small PFO noted with right to left shunting by contrast echo.  This is not noted by color Doppler.   Mild atherosclerosis of the descending thoracic aorta.   CONCLUSION:  A small PFO noted with right-to-left shunting by contrast  echo.  There is a normal left ventricular function with no significant  valvular abnormalities present.      Darlin Priestly, MD  Electronically Signed     RHM/MEDQ  D:  05/27/2007  T:  05/28/2007  Job:  [id number]

## 2011-03-14 NOTE — Discharge Summary (Signed)
NAMELIOR, HOEN NO.:  [id number]   MEDICAL RECORD NO.:  [id number]          PATIENT TYPE:  INP   LOCATION:  3005                         FACILITY:  [facility]   PHYSICIAN:  Marcellus Scott, MD     DATE OF BIRTH:  1946-10-02   DATE OF ADMISSION:  05/25/2007  DATE OF DISCHARGE:                               DISCHARGE SUMMARY   DISCHARGE DIAGNOSES:  1. Acute left middle cerebral artery branch infarct, secondary to      occlusion of posterior division of left middle cerebral artery,      with global aphasia.  2. Left middle cerebral artery occlusion.  3. Asymptomatic sinus bradycardia.  4. Patent foramen ovale.   DISCHARGE MEDICATIONS:  1. Enteric coated aspirin 325 mg p.o. daily.  2. Protonix 40 mg p.o. daily.   PROCEDURES:  1. Chest x-ray on July 26, with minimal bronchitic changes and right      basilar atelectasis.  2. CT of the head without contrast: with mild generalized atrophy      without mass or hemorrhage.  Tiny foci of gas at skull base, axilla      and cerebellar regions, uncertain significance.  No evidence of a      calvarial or skull base fracture nor evidence of significant or      penetrating sinus disease.  3. Magnetic resonance imaging of the brain without contrast: with      acute infarct in the left posterior middle cerebral artery      territory involving the posterior insula, temporoparietal lobe and      some of the left parietal lobe.  There may be some thrombosis at      the middle cerebral artery branch vessel versus a small parenchymal      hemorrhage.  4. Magnetic resonance angiogram of the head without contrast: with      high-grade stenosis or occlusion of the posterior division in the      left middle cerebral artery.  5. Carotid Dopplers preliminary reported as no internal carotid artery      stenosis bilaterally.  Vertebral artery antegrade flow.  6. Transcranial Doppler completed and report is pending.  7. Two-dimensional  transthorasic echocardiogram, report pending.  8. Transesophageal echocardiogram done on July 28, preliminary report      with mild left ventricular hypertrophy with normal left ventricular      function with ejection fraction 60%.  Mildly thickened aortic      valve, trivial regurgitation.  Mildly thickened mitral leaflet with      trivial or mild regurgitation.  Slightly normal tricuspid valve      with trivial regurgitation.  No intracardiac mass or thrombus      noted.  Positive for patent foramen ovale with right to left      shunting by contrast echocardiogram.  Mild atherosclerosis of      descending aorta.   LABORATORY DATA AND X-RAY FINDINGS:  A1c 5.7.  Homocystine 13.6.  TSH  1.65.  Urine toxicology is negative.  Urinalysis with no features of  UTI.  Blood alcohol level negative.  Comprehensive metabolic panel  unremarkable.  Point of care cardiac markers negative.  CBC is normal.   CONSULTATIONS:  1. Neurology, Dr. Anne Hahn and Dr. Pearlean Brownie.  2. Speech therapy.  3. Physical/occupational therapy.  4. Comprehensive inpatient rehabilitation.   HOSPITAL COURSE:  For details of initial admission, please refer to the  history and physical note that was done by Dr. Lovell Sheehan.  In summary, Mr.  Safley is a pleasant, 65 year old, [place], male patient with no  significant past medical history.  He was in his usual state of health  until approximately 4 p.m. on July 25, when while driving his car with  his spouse, he had a sudden transient of decreased vision, unsure if  single eye or both eye.  He pulled to the sidewalk and after a few  minutes his vision improved and the patient was able to drive home.  However, later that night, he was noted to be confused with unsteady  gait and abnormal speech content.  Following this, the patient was  brought to the emergency room.  He was assessed to have altered mental  status and to rule out cerebrovascular accident.  He was admitted to  the  telemetry bed and had extensive evaluation as indicated above.  He is  noted to have acute left MCA territory stroke with global aphasia.  He  has been placed on aspirin.  Neurology was consulted.  The patient awake  and alert but was not initially responding to pure verbal commands.  This seems to be slowly improving. However, to visual cues, he will obey  some command.  Intermittently, he seems to be talking appropriately  according to his family.  The patient does not have any other motor  deficits.  Evaluation at this time has revealed patent foramen ovale,  however, the size of it is unclear.  Will follow up the final TEE report  and await neurology review and recommendations.  Physical therapy has  evaluated the patient and have suggested CIR consult which will be done.  On patient's discharge, family would like a letter addressed to his  employers indicating his inability to work for an undetermined amount of  time considering his neurological status.  At work, the patient operates  a Runner, broadcasting/film/video.      Marcellus Scott, MD  Electronically Signed     AH/MEDQ  D:  05/28/2007  T:  05/28/2007  Job:  [id number]   cc:   Marlan Palau, M.D.  Pramod P. Pearlean Brownie, MD

## 2011-03-14 NOTE — Discharge Summary (Signed)
Kyle [name], Kyle [name] NO.:  [id number]   MEDICAL RECORD NO.:  [id number]          PATIENT TYPE:  INP   LOCATION:  1510                         FACILITY:  [facility]   PHYSICIAN:  Peggye Pitt, M.D. DATE OF BIRTH:  12-25-1945   DATE OF ADMISSION:  03/08/2009  DATE OF DISCHARGE:  03/11/2009                               DISCHARGE SUMMARY   DISCHARGE DIAGNOSES:  1. Right lower lobe pneumonia.  2. Acute chronic obstructive pulmonary disease exacerbation.  3. Leukocytosis.  4. Hyperlipidemia.  5. History of left middle cerebral artery vascular event.   DISCHARGE MEDICATIONS:  Include:  1. Avelox 400 mg daily until Mar 18, 2009.  2. Aspirin 325 mg daily.  3. Prednisone 50 mg on Mar 12, 2009, and decrease by 10 mg daily until      off.  4. Zocor 20 mg at bedtime.  5. Advair 250/50 mg 1 puff twice daily.  6. Spiriva 18 mcg inhaled daily.   DISPOSITION AND FOLLOWUP:  Kyle [name] is discharged home today in stable  condition.  He is maintaining good O2 saturations off oxygen.  I would  recommend that he have a repeat chest x-ray in 4 to 6 weeks to follow up  the resolution of his pneumonia.  Of note, he did have a marked increase  in leukocytosis on day of discharge from 8 to 20,000, however, he  remains afebrile and clinically looks well.  I have ordered a stat CBC  prior to discharge as I suspect this is possibly a lab error.   CONSULTATIONS THIS HOSPITALIZATION:  None.   IMAGES AND PROCEDURES THIS HOSPITALIZATION:  Include:  1. A chest x-ray on Mar 08, 2009, that showed a right lower lobe      pneumonia.  2. A CT angiogram of the chest on Mar 09, 2009, that showed      respiratory motion artifact limited exam with no evidence of acute      PE.  Mucoid impaction of the airway of the left upper lobe and      right lower lobe.   HISTORY AND PHYSICAL EXAM:  For full details, please refer to dictation  on Mar 09, 2009, by Dr. Ninfa Linden but in brief Kyle [name] is a  pleasant 65-  year-old man from [place] who presented with about 3-day history of  shortness of breath that progressively worsened.  Denied any fever,  rigors, or chills but he has had a significant cough with mucopurulent  expectoration.  A chest x-ray was done in the ED which showed right  lower lobe pneumonia and hence he was brought into the hospital for  further evaluation and management.   HOSPITAL COURSE BY ACTIVE PROBLEM:  1. Right lower lobe pneumonia.  He was initially treated with Rocephin      and azithromycin.  All of his culture data has been negative to      date.  He has been transitioned to p.o. Avelox which he will      complete on Mar 18, 2009.  I do recommend that a followup chest x-  ray be done in 4 to 6 weeks to follow up the resolution of his      pneumonia.  This can be ordered by his primary care physician.  2. For his acute COPD exacerbation, probably precipitated by his      pneumonia, we are now tapering his steroids.  I have switched him      over to Advair and Spiriva for a more complete COPD regimen.  3. For his hyperlipidemia, he is to continue on his statin.  He does      have a history of a left middle cerebral artery CVA and for this he      has been maintained on aspirin.  4. Leukocytosis.  Initially upon admission, his white blood cell count      was 13,400.  This decreased to 8.6 on day prior to discharge.  On      day of discharge, he had a marked increase in leukocytosis to      20,000; however, he remains afebrile and clinically looks well.  I      have ordered a stat CBC as I suspect that this is possibly a lab      error prior to his discharge.  If this is okay, we will proceed      with his discharge.  5. Rest of chronic medical issues have not been a problem this      hospitalization.   VITAL SIGNS ON DAY OF DISCHARGE:  Blood pressure 110/71.  Heart rate 89.  Respirations 20.  O2 saturations 97% on room air with a temp of 97.5.    LABS ON DAY OF DISCHARGE:  Sodium 140, potassium 3.9, chloride 110,  bicarb 24, BUN 13, creatinine 0.76 with a glucose of 129.  WBCs 20.1,  hemoglobin 14.8, and a platelet count of 206.      Peggye Pitt, M.D.  Electronically Signed     EH/MEDQ  D:  03/11/2009  T:  03/11/2009  Job:  [id number]   cc:   Deatra James, M.D.  Fax: [phone]

## 2011-03-14 NOTE — Discharge Summary (Signed)
Kyle [name], Kyle [name] NO.:  [id number]   MEDICAL RECORD NO.:  [id number]          PATIENT TYPE:  IPS   LOCATION:  4029                         FACILITY:  [facility]   PHYSICIAN:  Ranelle Oyster, M.D.DATE OF BIRTH:  [birth year]   DATE OF ADMISSION:  05/30/2007  DATE OF DISCHARGE:  06/12/2007                               DISCHARGE SUMMARY   DISCHARGE DIAGNOSES:  1. Left posterior cerebral artery infarct.  2. Dyslipidemia.  3. Patent foramen ovale with right-to-left shunt.   HISTORY OF PRESENT ILLNESS:  Kyle [name] is a 65 year old male of  [place] descent, admitted July 26 with altered speech, balance  problems, and visual changes.  MRI/MRA of brain showed acute left PCA  infarct with question thrombosis distal MCA branch and high-grade  stenosis of occlusion posterior left MCA.  Carotid Dopplers done showed  no ICA stenosis.  Transesophageal echocardiogram showed EF of 60%,  positive for PFO with a right-to-left shunt, bilateral lower extremity  Dopplers negative for DVT.  Neuro recommends aspirin for CVA  prophylaxis.  Patient noted to have expressive greater than receptive  aphasia with some improvement in verbal output, decreased sensation  right side with ataxia.  Currently, the patient with impairments in  mobility and self-care and rehab consulted for progressive therapies.  Past medical history is negative for prior illnesses or surgery.   ALLERGIES:  NO KNOWN DRUG ALLERGIES.   FAMILY HISTORY:  Family history is negative for prior illnesses.   SOCIAL HISTORY:  The patient is married.  Was working in [employer].  Lives in one level home with three steps at  entry.  Quit tobacco 6 months ago.  Alcohol:  Uses occasional beer.  Family is very supportive and wife can provide supervision past  discharge.   HOSPITAL COURSE:  Kyle [name] was admitted to rehab on May 30, 2007, for  inpatient therapies to consist of PT and OT daily.  Past  admission he  was maintained on regular diet without any signs of dysphagia.  Labs  done past admission revealed hemoglobin 15, hematocrit 45.3, white count  7.6, platelets 235,000.  Check of lytes revealed sodium 138, potassium  3.5, chloride 108, CO2 25, BUN 13, creatinine 0.8, glucose 87.  LFTs  within normal limits with SGOT 24, SGPT 20, alkaline phos 66, total  bilirubin 1.  The patient's blood pressures were monitored on a b.i.d.  basis during this stay and have ranged from low 100s to 110 systolic and  50s to 60s diastolic.  Heart rate stable in 50s.  The patient's p.o.  intake has been good.  He has been continent of bowel and bladder.  Check of lipid panel revealed cholesterol at 195, triglycerides 93, HDL  34, LDL 142, VLDL 19.  The patient was started on Zocor 20 mg a day for  dyslipidemia.   The patient continued to have impairments in mobility secondary to  ataxia, decreased coordination right side greater than left.  A right  knee sleeve was ordered to help with knee instability.  He continues to  be limited  secondary to apraxia.  Currently the patient is at  overall  supervision level for self-care, mini assist for tub transfers.  The  patient is supervision for transfers to wheelchair, supervision for  wheelchair mobility of 150 feet.  He is able to ambulate 150 feet x3  with a rolling walker with supervision.  He is able to navigate six  steps with min assist.  The patient is 50% accurate for basic.  He has  no biographical and factual information.  He is able to follow one-step  commands at min assist, two-step with max assist.  He is mod to max  assist for complex information with 42% accuracy.  Total assist for  multi-step commands and for conversation.  He is noted to have very  concrete perseverative on output.  Requires mod assist for automatic  speech, is 80% accurate for repetition, max assist with significant  anomia for  simple confrontational responsive  naming.  He continues to  require min assist to express wants and needs.  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  He has made positive gains in simple prereading  and writing skills.  Severity of language impairments affect basic  communications of wants and needs.  He shows that did poor awareness  versus problems with comprehension of situation.  The patient's family  will provide 24-hour supervision past discharge.  The patient will  continue with further followup, home health PT/OT, [facility] by  [facility] past discharge.  On June 12, 2007 the patient is  discharged to home.   DISCHARGE MEDICATIONS:  1. Coated aspirin treated with 325 mg a per day.  2. Zocor 20 mg p.o. at bedtime.  3. Prilosec OTC 20 mg per day p.r.n.   DIET:  Low-fat.   ACTIVITY:  Activity level is 24-hour supervision.  No strenuous  activity.  No alcohol, no smoking, no driving.  Walk with supervision.   FOLLOW UP:  The patient to follow up with Dr. Riley Kill on September 12 at  11:20.  Follow up with Dr. Wynelle Link on August 19 8:15.  Follow up with Dr.  Pearlean Brownie in 1 month repeat __________ and further input on the PFO  treatment.      Greg Cutter, P.A.      Ranelle Oyster, M.D.  Electronically Signed    PP/MEDQ  D:  06/12/2007  T:  06/13/2007  Job:  [id number]   cc:   Pramod P. Pearlean Brownie, MD  Deatra James, M.D.

## 2011-03-14 NOTE — Assessment & Plan Note (Signed)
Kyle [name] is back regarding a left PCA stroke.  Right shoulder is much  better after injection in last April.  He states his pain is only 4-  5/10.  He is on Neurontin still.  He denies any specific pain in the arm  and leg today.  Mood has been good.  He has been eating well.  He is  back in physical therapy to improve his gait and balance.  He complains  of some hyperextension of the knee when he stands, but denies pain  there.  He uses a cane for balance, but often walks without it.   On review of systems, the patient reports the above.  The full review is  in the written health and history section in the chart.   SOCIAL HISTORY:  The patient is married and his wife is very supportive.   PHYSICAL EXAMINATION:  VITAL SIGNS:  Blood pressure is 141/90, pulse 78,  respiratory rate 18, and he is sating 96% on room air.  GENERAL:  The patient is pleasant, alert, and oriented x3.  Affect is  bright and appropriate.  He speaks some [place].  NEUROLOGIC:  He walks with a wide-based gait.  The right knee does  hyperextend in stance.  He uses a cane somewhat for balance.  He is a  bit impulsive with his movements.  He had full range of motion at his  shoulder today.  Strength in right upper extremity is 4/5 and right  lower extremity is 4+/5.  Sensation is grossly intact.  Cranial nerve  exam was unremarkable today.  HEART: Regular.  CHEST:  Clear.  ABDOMEN:  Soft and nontender.   ASSESSMENT:  1. Left posterior cerebral artery infarct.  2. 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  3. Dyslipidemia.  4. Hypertension.   PLAN:  1. I recommended weekly followup of blood pressure over the next      month.  If he has persistent elevation, we will need to begin him      on an agent.  I also offered checkups of his blood pressure here at      the office.  2. We will taper off Neurontin.  3. Continue physical and occupational therapy.  I would like to see,  if he benefits from a brace or at least a heel insert to prevent      knee hyperextension.  4. I will see him back in about 3 months' time.      Ranelle Oyster, M.D.  Electronically Signed     ZTS/MedQ  D:  05/27/2008 11:05:44  T:  05/28/2008 03:39:22  Job #:  [id number]   cc:   Pramod P. Pearlean Brownie, MD  Fax: [phone]

## 2011-03-14 NOTE — Consult Note (Signed)
[name], [name]:  [id number]   MEDICAL RECORD NO.:  [id number]          PATIENT TYPE:  INP   LOCATION:  3005                         FACILITY:  [facility]   PHYSICIAN:  Marlan Palau, M.D.  DATE OF BIRTH:  [birth year]   DATE OF CONSULTATION:  DATE OF DISCHARGE:                                 CONSULTATION   HISTORY OF PRESENT ILLNESS:  [name] is a 65 year old [place]  male, right-handed, born 1946-08-29 with a history with a past  medical history that is unremarkable.  The patient is really not  followed by doctors, has been on no medications, has no prior history of  surgery or other medical illnesses that is known previously.  On the day  prior to admission around 4:00 p.m. on the July 25, the patient was  operating a motor vehicle, when he apparently had a sudden change in his  vision.  Is not clear.  The patient may have lost most of his vision  temporarily but appeared to regain enough vision.  He was able to drive  the rest of the way back home.  The patient was noted to be staggery  with his balance and had altered speech.  When the patient would talk,  he would not make sense.  Patient is fluent in both [place] and  [place], has worked in the past as an Equities trader.  The patient  underwent a CT scan of the brain initially that was unremarkable, and  eventually an MRI scan of the brain and MRI angiogram of intracranial  vessels was performed.  This revealed evidence of a left middle cerebral  artery distribution stroke and some marked attenuation/occlusion of the  distal branches of the left middle cerebral artery.  The posterior  cerebral artery on the left arises from the anterior circulation.  There  is some question of some minimal hemorrhage associated with the stroke.  Neurology was asked to see the patient for further evaluation.  The  patient has no definite weakness of the extremities.  NIH stroke scale  is 6.   PAST  MEDICAL HISTORY:  1. Is notable for one new onset of aphasias syndrome with left middle      cerebral artery distribution stroke by MRI.  2. History of tobacco abuse.  No surgical history.  The patient is on      no medications prior to admission, smokes and drinks on occasion.      Does not smoke regularly.   ALLERGIES:  The patient has no known allergies.   SOCIAL HISTORY:  The patient is married, lives in the [place] area.  Does work in Set designer , has one son who is alive and well, is  fluent in both [place] and [place]   FAMILY MEDICAL HISTORY:  Mother is still alive.  She has no known  medical illnesses.  Father died after a diarrheal illness.  He died in  his sleep, cause unknown.  The patient has two brothers, two sisters,  both are alive and well.   REVIEW OF SYSTEMS:  Is unobtainable directly.  The patient has not  really complained of headache that the family knows of, did have some  chronic low back pain, prior to the stroke event.   PHYSICAL EXAMINATION:  VITALS:  Blood pressure is 149/93, heart rate 59,  respiratory rate 18, temperature afebrile.  GENERAL:  This patient is a well-developed [place] male who is alert,  cooperative at the time of examination.  HEENT:  Head is atraumatic.  Eyes:  Pupils are round, reactive to light.  Disks are flat bilaterally.  NECK:  Supple.  No carotid bruits noted.  RESPIRATORY:  Examination is clear.  CARDIOVASCULAR:  Reveals a regular rate and rhythm.  No obvious murmurs  or rubs noted.  EXTREMITIES:  Without significant edema.   NEUROLOGIC EXAMINATION:  CRANIAL NERVES:  As above.  Facial symmetry  appears be intact.  The patient appears to have good pinprick sensation  bilaterally, that by his reaction appears to be symmetric, has full  extraocular movements, blinks to threat on both sides, but less well  from the right as compared the left.  The patient is able to cut the  eyes fully across to from right to left.   Speech appears be fairly well  enunciated but is nonsensical.  The patient is unable to follow pure  verbal commands, is unable to read.  The patient could not name.  Motor  testing reveals 5/5 strength in all fours.  Good symmetric motor tone is  noted throughout.  No drift is seen in the arms or legs on either side.  The patient once again appears to have symmetric sensation to pinprick  and soft touch on both sides.  The patient has ability to form finger-  nose-finger and heel-to-shin well on both sides.  Gait was not tested.  Deep tendon reflexes were depressed but symmetric.  Toes are neutral  bilaterally.   LABORATORY VALUES:  Notable for white count of 7.9, hemoglobin of 14.9,  hematocrit 44.8, MCV of 82.7, platelets of 209, sodium 138, potassium  4.1, chloride of 111, CO2 24, glucose of 101, BUN of 10, creatinine  0.72, total bili 0.4, alk phosphatase 83, SGOT of 26, SGPT of 17, total  protein 6.8, albumin of 3.2, calcium 8.2, troponin-I of less than 0.05,  MB fraction 6.6, alcohol level less than 5.  Drug screen negative.  Urinalysis reveals specific gravity of 1.013, pH of 7.5.   MRI scan of the brain is as above.   CURRENT MEDICATIONS:  In the hospital include Protonix 40 mg IV q.24 h.  Aspirin has been ordered.  Zofran if needed.   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   This patient was active at the time of the onset stroke, which was of  sudden apoplectic onset.  The patient appears to have a cut-off at the  trifurcation in the middle cerebral artery, with attenuation of distal  vessels.  Stroke event does not seem to include the entire distribution  of the middle cerebral artery but is more patchy, suggesting there  likely is some flow to the left middle cerebral distribution.  On the  left side, the posterior cerebral artery comes off the anterior  circulation.  The patient's main deficit is an aphasia  syndrome for both  [place] and [place]  NIH stroke scale is 6.  The patient is not a t-  PA candidate, due to the duration of deficit.  The patient  had onset  deficit at 4:00 p.m., did not go to the emergency room until around  11:00 p.m. of the same day.   PLAN:  1. Will continue stroke workup, 2-D echocardiogram.  A carotid Doppler      study has been ordered.  2. A speech therapy evaluation with speech and language.  3. I agree with aspirin therapy for now.  4. Consider transcranial Doppler bubble study.  5. The patient will need a nursing bedside swallow protocol      evaluation.  6. The patient is n.p.o. currently, likely could eat.  Will follow the      patient's clinic course while in-house.  Blood work to include      fasting lipid profile, and homocystine level is pending.      Marlan Palau, M.D.  Electronically Signed    CKW/MEDQ  D:  05/25/2007  T:  05/26/2007  Job:  [id number]   cc:   [facility]

## 2011-03-14 NOTE — Assessment & Plan Note (Signed)
[name] is back regarding his left PCA stroke.  Right shoulder has  begun to act up again.  He had done previously well with last injection  in April.  We are going off of the Neurontin as we are not sure if it is  helping him at this point.  Apparently, pain has returned with numbness  and tingling especially late at night.  His PCP apparently put him back  on 100 mg 3 times a day.  Pain is 8/10 described as aching and burning.  Sometimes tingling as well.  Pain interferes with general activity,  relations with others, and enjoyment of life on a moderate level.  Sleep  can be interfered occasionally due to the pain.   REVIEW OF SYSTEMS:  Notable for the above.  Full review is written out  in history section in the chart.  He does note some popping occasionally  in his right knee.   SOCIAL HISTORY:  The patient is married.  Lives with his wife.   PHYSICAL EXAMINATION:  VITAL SIGNS:  Blood pressure 154/86, pulse 82,  and respiratory rate is 16.  He is sating 93% on room air.  GENERAL:  The patient is pleasant, alert, and oriented x3.  He walks  with hyperextension throughout the right knee and uses cane for balance.  Gait is generally stable.  EXTREMITIES:  Shoulder had some tightness and pain with impingement  maneuvers and shoulder abduction past 90 degrees.  Strength in the right  upper extremity is 4/5 still 4+ to 5/5 in right lower extremity.  Sensation is grossly intact to pain and light touch.  Cranial nerve exam  is normal.  Cognitively seemed to be intact, although there is language  barrier there.  HEART:  Regular.  CHEST:  Clear.  ABDOMEN:  Soft and nontender.   ASSESSMENT:  1. Left posterior cerebral artery infarct.  2. Adhesive capsulitis of the right shoulder.  3. Dyslipidemia.  4. Hypertension.   PLAN:  1. We will increase Neurontin over the next weeks time to 300 mg      t.i.d.  He seemed to be doing well on this before.  2. After informed consent, we injected  to right shoulder with 40 mg of      Kenalog and 3 mL of 1% of lidocaine via lateral approach.  The      patient tolerated well.  3. I will see him back in about 2 months' time.  I think he should be      fine with this plan.  I have encouraged follow up with Dr. Pearlean [name] if      they have further questions regarding his neurological workup.      Kyle [name], M.D.  Electronically Signed     ZTS/MedQ  D:  08/26/2008 10:59:47  T:  08/27/2008 01:40:16  Job #:  [id number]   cc:   Kyle P. Pearlean Brownie, MD  Fax: [phone]

## 2011-03-14 NOTE — Assessment & Plan Note (Signed)
[name] is back regarding his left PCA stroke.  He did have good  results of the right shoulder injection.  He states that none until last  night , but about midnight he have any pain in the right arm.  Pain  seemed to go from the arm down to the side and into the leg.  Described  as tingling and burning.  He has been taking his Neurontin 100 mg b.i.d.  as prescribed.  He is seeing [facility] apparently at [facility] now.  The patient's review of systems is negative except for  that above.  Full review is in the written health and history section of  the chart.   SOCIAL HISTORY:  Unchanged.  He is with the church member here for  translation again today.   PHYSICAL EXAMINATION:  VITAL SIGNS:  Blood pressure 141/71, pulse 68,  and respiratory rate 18.  He is sating 95% on room air.  GENERAL:  The patient is pleasant, alert, and oriented x3.  EXTREMITIES:  Strength is 4+ to 5-/5 on the right.  He still has some  mild decreased fine motor movement.  He is functional with his gait  currently.  Right shoulder is minimally tender with range of motion and  provocative testing today.  No tenderness.  Pain was appreciated.  Right  lower extremity seems a bit hypersensitive to touch, but not frankly  allodynia.  Skin color is intact.  He may have some trace edema in right  lower extremity.  NEUROLOGIC:  Cranial nerve exam is stable.  Cognitively, he is  appropriate through his translator.  HEART:  Regular.  CHEST:  Clear.  ABDOMEN:  Soft, nontender.   ASSESSMENT:  1. Left posterior cerebral artery infarct.  2. Adhesive capsulitis/bicipital tendonitis, right shoulder.  3. Neuropathic pain, right side.   PLAN:  1. We will titrate Neurontin to 100 mg t.i.d.  I do not think it is      wise to overreact to his symptoms, which have been present for      literally less than 12 hours at this point.  If symptoms do worsen,      we can look at further increasing his Neurontin or  trying something      different.  2. I encouraged exercise, range of motion, strengthening exercises.      As a whole, he has done very nicely.  3. I will see him back in 6 months' time.      Ranelle Oyster, M.D.  Electronically Signed     ZTS/MedQ  D:  06/11/2009 10:44:30  T:  06/11/2009 14:59:39  Job #:  [id number]   cc:   [facility]

## 2011-03-14 NOTE — Assessment & Plan Note (Signed)
HISTORY:  Mr. Casteneda is back regarding his left PCA stroke and right  shoulder pain.  Right shoulder seems to be bothering him again with some  radiation down into the elbow.  He complaints of right leg pain.  He has  come off Neurontin again and pain seems to coincide with going up in  that.  The patient was in the emergency room for respiratory symptoms.  In May, it seems to have recovered from the infection.  The patient's  pain is 10/10 today, described as burning.  He is only taking his  cholesterol medication and aspirin daily.  Pain interferes with general  activity, relations with others, enjoyment of life on a severe level.   REVIEW OF SYSTEMS:  Notable for the above as well as some confusion,  numbness.  Full 14-point review is in the written health and history  section of the chart.   SOCIAL HISTORY:  The patient is married with his wife.  Church members  again with them today for translation.   PHYSICAL EXAMINATION:  VITAL SIGNS:  Blood pressure is 140/83, pulse 73,  and respiratory rate 80.  He is sating 95% on room air.  GENERAL:  The patient is pleasant, alert, and oriented x3.  Affect is  bright and appropriate.  Gait is stable with his cane.  EXTREMITIES:  Right shoulder has notable pain with rotator cuff  maneuvers, but bicipital tendon seems to be more concordant with his  pain complaint today.  He has some generalized weakness in the right  leg, particularly at the right ankle today with range of motion.  No  particular swelling was seen.  Areas grossly intact for sensation.  Strength is 4+-5/5 in the right upper and lower extremities today.  He  is 5/5 on the left side today.  Cognitively, generally intact.  HEART:  Regular.  CHEST:  Clear.  ABDOMEN:  Soft, nontender.   ASSESSMENT:  1. Left posterior cerebral artery  infarct.  2. 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   PLAN:  1. We will restart Neurontin 100 mg  b.i.d.  2. Inject the right long head biceps tendon with 40 mg of Kenalog and      3 mL of 1% lidocaine after aspiration.  The patient tolerated well.  3. Recommended regular Tylenol and ice to the shoulder and ankle as      described in the office today.  4. I will see him back in about 2 months for followup.      Ranelle Oyster, M.D.  Electronically Signed     ZTS/MedQ  D:  04/12/2009 10:14:54  T:  04/13/2009 00:33:36  Job #:  [id number]

## 2011-03-14 NOTE — Assessment & Plan Note (Signed)
[name] is back regarding his left PCA stroke.  He has been home with  his family.  The Neurontin has worked well for his right-sided  neuropathic pain.  He still has occasional arthritic pain in the wrist.  He reports his shoulder is tender as well with movement.  He has had  fair control of his blood pressure by his family's report.  He is  scheduled to see Dr. Pearlean Brownie next week for his patent foramen ovale and  further workup and plan as related to his stroke.  The patient is eating  well.  He has fair sleep at this point.  Still needs some assistance  with dressing and IADLs.   REVIEW OF SYSTEMS:  Notable for the above.  Full review is in the  written health and history section of the chart.   SOCIAL HISTORY:  The patient is married and wife is present today with  him, and remains supportive.   PHYSICAL EXAMINATION:  Blood pressure is 142/85, pulse is 56,  respiratory rate 18, he is satting 94% on room air.  The patient is pleasant, alert and oriented x3.  There seems to a  language barrier, but we were able to work through that between his wife  and family member.  The patient walks with a bit of rigidity on the right side, but is  stable with normal walking pattern today.  He is a little bit unbalanced  with heel-to-toe gait.  He uses a cane for balance.  Right shoulder is tender with range of motion, particularly with  abduction, but also with internal rotation today.  I was able to range  him passively only to about 40 to 45 degrees before pain set in.  Right-sided sensory exam remains 1 out of 2.  Strength is in the realm  of 4 out of 5 throughout, except for the right shoulder which is blocked  by tightness.  HEART:  Regular.  CHEST:  Clear.  ABDOMEN:  Soft, nontender.   ASSESSMENT:  1. Left posterior cerebral artery infarct.  2. Dyslipidemia.  3. Patent foramen ovale with right-to-left shunt.   PLAN:  1. Follow up with Dr. Pearlean Brownie next week regarding plan for patent      foramen ovale.  2. Continue Neurontin 300 mg t.i.d. for neuropathic pain.  This has      worked well.  3. Will send the patient to outpatient physical therapy to work on      range of motion and use of the right upper extremity.  I      recommended Tylenol for now for baseline pain control.  He may need      injection therapy as well      to facilitate movement.  4. I will see him back in about 2 months' time.      Ranelle Oyster, M.D.  Electronically Signed     ZTS/MedQ  D:  01/01/2008 10:26:18  T:  01/01/2008 11:38:49  Job #:  [id number]   cc:   Pramod P. Pearlean Brownie, MD  Fax: [phone]   Deatra James, M.D.  Fax: [phone]

## 2011-03-14 NOTE — Assessment & Plan Note (Signed)
[name] is back regarding his left PCA stroke.  He denies any pain in  the right hand today, although he still has numbness.  His shoulder  seems to be giving him problems, however, and he rates the pain there as  6/10.  He describes the pain as aching . The patient is walking with a  cane and sometimes without.  His gait has really improved.  The  patient's appetite has been good as well as mood.  Speech and cognition  seem to be returning to baseline.  He saw Dr. Pearlean Brownie for followup of his  patent foramen ovale.  He has another study scheduled for next month, it  sounds like per his wife.   REVIEW OF SYSTEMS:  Notable for the above.  Full review is in the  written Health and History section on the chart.   SOCIAL HISTORY:  The patient is married, and family is supportive.   PHYSICAL EXAMINATION:  VITAL SIGNS:  Blood pressure 135/61, pulse 53,  respiratory rate 18, saturating 96% on room air.  NEUROLOGIC:  The patient is pleasant, alert and oriented with translator  x3. Affect is bright.  Gait is stable with and without cane.  He has  some difficulty with proprioception in the right leg but seems to have  accommodated for this.  He can be a bit impulsive at times.  Strength in  right lower extremity is 4 to 4+/5.  Right upper extremity is 3+ to 4/5  with tightness still at the right shoulder, particularly at the biceps  tendons.  His short head biceps tendon was very painful to touch today.  He is limited to about 40 degrees of abduction and 15 degrees of  rotation at the shoulder due to tightness and pain today.  HEART:  Regular rhythm, slightly bradycardic.  CHEST:  Clear.  ABDOMEN:  Soft, nontender.   ASSESSMENT:  1. Left posterior cerebral artery infarct.  2. Adhesive capsulitis of the right shoulder with biceps occipital      tenonitis.  3. Dyslipidemia.  4. Patent foramen ovale.   PLAN:  1. Continue followup per neurology.  2. After informed consent, we injected the  right short head biceps      tendon with 40 mg of Kenalog and 3 mL 1% lidocaine.  The patient      tolerated it well.  3. Continue Neurontin for now for neuropathic pain.  4. Encouraged exercise range of motion to the shoulder once pain      improves.  Recommended ice and Tylenol for      any increase in pain.  5. I will see him back in about 3 months.      Ranelle Oyster, M.D.  Electronically Signed     ZTS/MedQ  D:  02/26/2008 11:46:48  T:  02/26/2008 11:57:38  Job #:  [id number]   cc:   Pramod P. Pearlean Brownie, MD  Fax: [phone]   Deatra James, M.D.  Fax: [phone]

## 2011-03-14 NOTE — H&P (Signed)
NAMEQUENCY, TOBER NO.:  [id number]   MEDICAL RECORD NO.:  [id number]          PATIENT TYPE:  INP   LOCATION:  1510                         FACILITY:  [facility]   PHYSICIAN:  Oswald Hillock, MD        DATE OF BIRTH:  1946-05-28   DATE OF ADMISSION:  03/08/2009  DATE OF DISCHARGE:                              HISTORY & PHYSICAL   CHIEF COMPLAINT:  Respiratory distress.   HISTORY OF PRESENT ILLNESS:  The patient is a 65 year old [place]  Asian immigrant from [place] who presents to the emergency room with  complaints of shortness of breath that started about 3 days back and has  been progressively getting worse.  He denies any fever, rigors, chills,  but has had significant cough with mucoid to mucopurulent expectoration.  No palpitations, no nausea, vomiting, diarrhea or any urinary symptoms.   PAST MEDICAL HISTORY:  1. 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  2. History of tobacco use in the past.  3. History of patent foramen ovale.  4. History of asymptomatic sinus bradycardia.  5. History of left middle cerebral artery occlusion.   PAST SURGICAL HISTORY:  None.   CURRENT MEDICATIONS:  Simvastatin 20 mg 1 p.o. q.h.s.   ALLERGIES:  No known drug allergies.   FAMILY HISTORY:  No history of hypertension, coronary artery disease, or  diabetes mellitus in the family.   SOCIAL HISTORY:  The patient quit smoking 20 years back.  No history of  alcohol or drug use.  Lives with family, is independent in most of his  ADL's even after the stroke.   PHYSICAL EXAMINATION:  VITAL SIGNS:  On admission, pulse of 110, blood  pressure 160/79, respiratory rate 24, and oxygen saturations are 100% on  2 liters nasal cannula.  GENERAL:  The patient is conscious, alert, oriented to time, place and  person in mild to moderate distress with audible wheeze.  HEENT:  No scleral icterus.  No pallor.  Ears negative.  Poor oral  dental hygiene.  NECK:  Supple.  No lymphadenopathy.  No JVD.  No carotid bruit.  CHEST:  Breath sounds heard bilaterally.  Bilateral rhonchi.  Crackles  at the right base.  CVS:  S1 and S2 plus.  Regular tachycardia.  No gallop or rub.  ABDOMEN:  Soft and nontender.  Bowel sounds present.  EXTREMITIES:  No cyanosis, clubbing or edema.  NEUROLOGIC:  Cranial nerves II through XII are grossly intact.  The  patient moves all 4 extremities.  Plantars are bilaterally downgoing.   LABORATORY DATA:  His sodium is 143, potassium 4.2, chloride 108, CO2  29, glucose 129, BUN 9, creatinine 1.01, myoglobin 72, CK-MB of 3.6,  troponin less than 0.05, WBC count 13.4, hemoglobin 17, hematocrit 50.8,  platelet count of 226,000, neutrophils 56%, and granulocytes 7.5%.   Chest x-ray showed a right lower lobe pneumonia.  EKG showed sinus  tachycardia with occasional PVC's, left axis deviation, and poor R-wave  progression in anterior leads.  No previous EKG's available  for  comparison.   IMPRESSION AND PLAN:  This is a case of a 65 year old [place] Asian  immigrant who presents with respiratory distress.  1. Right lower lobe pneumonia.  Given the patient's multiple      comorbidities, we will admit him to Medical Service for further      evaluation and management.  Will start him on antibiotics, i.e.      Rocephin and Zithromax, put him on nebs albuterol and Atrovent      q.6h. and q.2h. p.r.n.  Given his previous history of stroke, we      will get a swallow evaluation to rule out the possibility of      aspiration.  2. History of stroke.  The patient is currently on no aspirin or      Plavix.  Will restart him on aspirin if there is no other      contraindications.  3. Hyperlipidemia.  Check fasting lipid panel.  LFT's are within      normal limits currently with simvastatin.  4. Deep vein thrombosis/gastrointestinal prophylaxis.  Protonix and      subcutaneous heparin.      Oswald Hillock,  MD  Electronically Signed     BA/MEDQ  D:  03/09/2009  T:  03/09/2009  Job:  [id number]   cc:   Ranelle Oyster, M.D.  Fax: [phone]

## 2011-06-26 ENCOUNTER — Encounter: Payer: Medicare Other | Attending: Physical Medicine & Rehabilitation | Admitting: Physical Medicine & Rehabilitation

## 2011-06-26 DIAGNOSIS — IMO0002 Reserved for concepts with insufficient information to code with codable children: Secondary | ICD-10-CM

## 2011-06-26 DIAGNOSIS — M7500 Adhesive capsulitis of unspecified shoulder: Secondary | ICD-10-CM | POA: Insufficient documentation

## 2011-06-26 DIAGNOSIS — I69998 Other sequelae following unspecified cerebrovascular disease: Secondary | ICD-10-CM | POA: Insufficient documentation

## 2011-06-26 DIAGNOSIS — M7530 Calcific tendinitis of unspecified shoulder: Secondary | ICD-10-CM

## 2011-06-26 DIAGNOSIS — I6330 Cerebral infarction due to thrombosis of unspecified cerebral artery: Secondary | ICD-10-CM

## 2011-06-26 DIAGNOSIS — M79609 Pain in unspecified limb: Secondary | ICD-10-CM | POA: Insufficient documentation

## 2011-06-26 NOTE — Assessment & Plan Note (Signed)
The patient is back regarding left PCA stroke.  He is still having some pain in his right arm to his finger, even down the right side a bit.  In going through his niece who is with him today trying to get the complete story.  He does take his Neurontin as I have prescribed it.  He takes it once or twice a day.  He states it works for a few hours after he takes it, but it then wears off.  The last time I saw him, he was taking it on scheduled basis.  Sleep is good.  REVIEW OF SYSTEMS:  Notable for the above.  Full 12-point review is in the written health and history section of chart.  The patient is using a cane for balance.  SOCIAL HISTORY:  The patient is married.  Wife is with him today along with the niece.  PHYSICAL EXAMINATION:  VITAL SIGNS:  Blood pressure 144/76, pulse 87, respiratory rate 16, saturating 95% on room air. GENERAL:  The patient is pleasant, alert and oriented x3.  Affect showing bright appropriate. EXTREMITIES:  He had diminished sensation over the right arm at 1 to 1+/2.  Strength is 4-5/5.  He does still lack fine motor movement in the right arm and leg.  He uses a cane for balance and has a bit disjointed movement in the right leg, but is able to manage and has had no risk for falling.  Right shoulder is minimally tender with rotator cuff impingement maneuvers. HEART:  Regular. CHEST:  Clear. ABDOMEN:  Soft, nontender.  ASSESSMENT: 1. Left PCA infarct. 2. Adhesive capsulitis, right shoulder.  PLAN: 1. For the Neurontin to work, he needs to take it on a scheduled     basis.  We reaffirmed that today.  If he cannot adhere to that     schedule, he will look at something that is more simple to take.  I     made sure the patient and his wife both understood this today.  It     makes no sense to try something different if medication itself is     working for him. 2. I will see the patient back on as-needed basis.  Continue cane for     gait  imbalance.     Ranelle Oyster, M.D. Electronically Signed    ZTS/MedQ D:  06/26/2011 10:27:21  T:  06/26/2011 12:43:57  Job #:  [id number]  cc:   Deatra James, M.D. Fax: [phone]

## 2011-08-14 LAB — CBC
HCT: 45.3
Hemoglobin: 15
MCHC: 33.1
MCHC: 33.2
MCV: 83
Platelets: 209
RBC: 5.45
RDW: 15.6 — ABNORMAL HIGH

## 2011-08-14 LAB — COMPREHENSIVE METABOLIC PANEL
ALT: 20
AST: 26
Alkaline Phosphatase: 83
BUN: 13
CO2: 25
Calcium: 8.9
Calcium: 8.2 — ABNORMAL LOW
Creatinine, Ser: 0.79
GFR calc Af Amer: >60
GFR calc non Af Amer: >60
GFR calc non Af Amer: >60
Glucose, Bld: 87
Sodium: 138
Total Bilirubin: 0.4
Total Protein: 6.8

## 2011-08-14 LAB — URINALYSIS, ROUTINE W REFLEX MICROSCOPIC
Bilirubin Urine: NEGATIVE
Glucose, UA: NEGATIVE
Nitrite: NEGATIVE
Specific Gravity, Urine: 1.013
Specific Gravity, Urine: 1.022
Urobilinogen, UA: 0.2
pH: 6

## 2011-08-14 LAB — LIPID PANEL
Cholesterol: 195
LDL Cholesterol: 142 — ABNORMAL HIGH
Total CHOL/HDL Ratio: 5.7

## 2011-08-14 LAB — URINE CULTURE
Colony Count: >=100000
Special Requests: NEGATIVE
Special Requests: NEGATIVE

## 2011-08-14 LAB — URINALYSIS, MICROSCOPIC ONLY
Bilirubin Urine: NEGATIVE
Hgb urine dipstick: NEGATIVE
Specific Gravity, Urine: 1.01
Urobilinogen, UA: 1
pH: 6

## 2011-08-14 LAB — DIFFERENTIAL
Basophils Absolute: 0.1
Basophils Relative: 1
Eosinophils Absolute: 0.4
Lymphocytes Relative: 25
Lymphs Abs: 1.9
Monocytes Absolute: 0.9 — ABNORMAL HIGH
Neutro Abs: 3.8
Neutrophils Relative %: 57
Neutrophils Relative %: 48

## 2011-08-14 LAB — HEMOGLOBIN A1C: Hgb A1c MFr Bld: 5.7

## 2011-08-14 LAB — URINE MICROSCOPIC-ADD ON

## 2011-08-14 LAB — RAPID URINE DRUG SCREEN, HOSP PERFORMED
Opiates: NOT DETECTED
Tetrahydrocannabinol: NOT DETECTED

## 2011-08-14 LAB — POCT CARDIAC MARKERS
CKMB, poc: 6.6
Operator id: 133351
Troponin i, poc: <0.05

## 2011-12-06 DIAGNOSIS — J449 Chronic obstructive pulmonary disease, unspecified: Secondary | ICD-10-CM | POA: Diagnosis not present

## 2011-12-06 DIAGNOSIS — Z23 Encounter for immunization: Secondary | ICD-10-CM | POA: Diagnosis not present

## 2011-12-06 DIAGNOSIS — E7800 Pure hypercholesterolemia, unspecified: Secondary | ICD-10-CM | POA: Diagnosis not present

## 2011-12-19 ENCOUNTER — Encounter: Payer: Medicare Other | Attending: Physical Medicine & Rehabilitation | Admitting: Physical Medicine & Rehabilitation

## 2011-12-19 ENCOUNTER — Encounter: Payer: Self-pay | Admitting: Physical Medicine & Rehabilitation

## 2011-12-19 DIAGNOSIS — I69998 Other sequelae following unspecified cerebrovascular disease: Secondary | ICD-10-CM | POA: Diagnosis not present

## 2011-12-19 DIAGNOSIS — I10 Essential (primary) hypertension: Secondary | ICD-10-CM | POA: Diagnosis not present

## 2011-12-19 DIAGNOSIS — M7530 Calcific tendinitis of unspecified shoulder: Secondary | ICD-10-CM | POA: Diagnosis not present

## 2011-12-19 DIAGNOSIS — M25519 Pain in unspecified shoulder: Secondary | ICD-10-CM | POA: Insufficient documentation

## 2011-12-19 DIAGNOSIS — M79609 Pain in unspecified limb: Secondary | ICD-10-CM | POA: Diagnosis not present

## 2011-12-19 DIAGNOSIS — I639 Cerebral infarction, unspecified: Secondary | ICD-10-CM | POA: Insufficient documentation

## 2011-12-19 DIAGNOSIS — I6350 Cerebral infarction due to unspecified occlusion or stenosis of unspecified cerebral artery: Secondary | ICD-10-CM

## 2011-12-19 DIAGNOSIS — R209 Unspecified disturbances of skin sensation: Secondary | ICD-10-CM | POA: Insufficient documentation

## 2011-12-19 DIAGNOSIS — M79603 Pain in arm, unspecified: Secondary | ICD-10-CM

## 2011-12-19 MED ORDER — TRAMADOL HCL 50 MG PO TABS: 50.0000 mg | ORAL_TABLET | Freq: Two times a day (BID) | ORAL | Status: AC | PRN

## 2011-12-19 NOTE — Progress Notes (Signed)
  Subjective:    Patient ID: Kyle [name], male    DOB: December 30, 1945, 66 y.o.   MRN: [id number]  Shoulder Pain  The pain is present in the right shoulder, right arm, right elbow, right wrist and right hand. This is a chronic problem. The current episode started more than 1 year ago. There has been no history of extremity trauma. The problem occurs intermittently. The problem has been gradually worsening. The quality of the pain is described as sharp. The pain is at a severity of 5/10. The pain is moderate. Treatments tried: Gabapentin. The treatment provided no relief.   He is taking neurontin as prescribed.  Pain has worsened over the last 2-3 months. He describes it as pins and needles.   Review of Systems  Constitutional: Negative.   HENT: Negative.   Eyes: Negative.   Respiratory: Negative.   Cardiovascular: Negative.   Gastrointestinal: Negative.   Genitourinary: Negative.   Musculoskeletal: Negative.   Skin: Negative.   Neurological: Negative.   Hematological: Negative.   Psychiatric/Behavioral: Negative.        Objective:   Physical Exam  Vitals reviewed. Constitutional: He appears well-developed.  Eyes: EOM are normal. Pupils are equal, round, and reactive to light.  Neck: Normal [name] of motion.  Cardiovascular: Normal rate and regular rhythm.   Abdominal: Soft.  Musculoskeletal: Normal [name] of motion.       Minimal pain with RTC movement.  Shoulder moves fairly freely  Neurological: He has normal strength. A sensory deficit is present. No cranial nerve deficit.  Reflex Scores:      Tricep reflexes are 2+ on the right side and 2+ on the left side.      Bicep reflexes are 2+ on the right side and 2+ on the left side.      Brachioradialis reflexes are 2+ on the right side and 2+ on the left side.      Patellar reflexes are 2+ on the right side and 2+ on the left side.      Achilles reflexes are 2+ on the right side and 2+ on the left side.      Mild right pronator drift.   Sensory 1/2 on right side. Face unaffected. cogtnively appropriate.  There is a language barrier.          Assessment & Plan:  1. 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  i suspect some of increase in pain is due to the colder weather.  -we will stay with the same neurontin dose  -add prn ultram for breakthrough pain. -  -if pain is persistent, we will increase standing neurontin 2. Left shoulder pain/RTC-  -shoulder moving quite well and not the source of pain at present.  -continue activity as tolerated. 3. HTN- fair control. Follow up with pcp 4. F/U with me in 3 months

## 2011-12-19 NOTE — Patient Instructions (Addendum)
Use tramadol as needed.  Call me if pain is persistent. Follow up with me in 3 months.

## 2011-12-19 NOTE — Progress Notes (Signed)
Patient ID: Kyle [name], male   DOB: 02-17-1946, 66 y.o.   MRN: [id number]

## 2012-01-17 ENCOUNTER — Other Ambulatory Visit: Payer: Self-pay | Admitting: *Deleted

## 2012-02-12 ENCOUNTER — Other Ambulatory Visit: Payer: Self-pay | Admitting: Physical Medicine & Rehabilitation

## 2012-03-13 ENCOUNTER — Encounter: Payer: Medicare Other | Attending: Physical Medicine & Rehabilitation | Admitting: Physical Medicine & Rehabilitation

## 2012-03-13 ENCOUNTER — Encounter: Payer: Self-pay | Admitting: Physical Medicine & Rehabilitation

## 2012-03-13 VITALS — BP 142/82 | HR 100 | Resp 16 | Ht 68.0 in | Wt 178.0 lb

## 2012-03-13 DIAGNOSIS — M7581 Other shoulder lesions, right shoulder: Secondary | ICD-10-CM | POA: Insufficient documentation

## 2012-03-13 DIAGNOSIS — I6330 Cerebral infarction due to thrombosis of unspecified cerebral artery: Secondary | ICD-10-CM | POA: Insufficient documentation

## 2012-03-13 DIAGNOSIS — M67919 Unspecified disorder of synovium and tendon, unspecified shoulder: Secondary | ICD-10-CM

## 2012-03-13 DIAGNOSIS — M7582 Other shoulder lesions, left shoulder: Secondary | ICD-10-CM

## 2012-03-13 DIAGNOSIS — IMO0002 Reserved for concepts with insufficient information to code with codable children: Secondary | ICD-10-CM | POA: Diagnosis not present

## 2012-03-13 DIAGNOSIS — M792 Neuralgia and neuritis, unspecified: Secondary | ICD-10-CM | POA: Insufficient documentation

## 2012-03-13 DIAGNOSIS — M719 Bursopathy, unspecified: Secondary | ICD-10-CM | POA: Diagnosis not present

## 2012-03-13 DIAGNOSIS — I2510 Atherosclerotic heart disease of native coronary artery without angina pectoris: Secondary | ICD-10-CM | POA: Diagnosis not present

## 2012-03-13 NOTE — Progress Notes (Signed)
Subjective:    Patient ID: Kyle [name], male    DOB: 07-Jul-1946, 66 y.o.   MRN: [id number]  HPI Kyle [name] is back regarding his cva. He's doing quite well for the most part. He still complains of pain in the right arm and hand, but his son who is present today, states that all he does is take his meds and sit around the house.  He does do some light stretching, but is not engaged in regular physical activity around the home. He is not depressed.  He is taking his meds as prescribed. He is getting enough sleep each night.  Left shoulder is minimally tender today.   Pain Inventory Average Pain 6 Pain Right Now 6 My pain is burning  In the last 24 hours, has pain interfered with the following? General activity 6 Relation with others 0 Enjoyment of life 0 What TIME of day is your pain at its worst? morning Sleep (in general) Fair  Pain is worse with: walking Pain improves with: N/A Relief from Meds: N/a  Mobility walk without assistance how many minutes can you walk? 30 ability to climb steps?  yes do you drive?  no  Function disabled: date disabled   Neuro/Psych No problems in this area  Prior Studies Any changes since last visit?  no  Physicians involved in your care Any changes since last visit?  no   Review of Systems  Constitutional: Negative.   HENT: Negative.   Eyes: Negative.   Respiratory: Negative.   Cardiovascular: Negative.   Gastrointestinal: Negative.   Genitourinary: Negative.   Musculoskeletal: Negative.   Skin: Negative.   Neurological: Negative.   Hematological: Negative.   Psychiatric/Behavioral: Negative.        Objective:   Physical Exam  Constitutional: He is oriented to person, place, and time. He appears well-developed and well-nourished.  HENT:  Head: Normocephalic and atraumatic.  Eyes: EOM are normal. Pupils are equal, round, and reactive to light.  Neck: Normal [name] of motion. Neck supple.  Cardiovascular: Normal rate and  regular rhythm.   Pulmonary/Chest: Effort normal. No respiratory distress. He has no wheezes.  Abdominal: Bowel sounds are normal. He exhibits no distension. There is no tenderness.  Musculoskeletal:       Right shoulder: He exhibits tenderness. He exhibits normal [name] of motion.       Left shoulder: Normal.  Neurological: He is alert and oriented to person, place, and time. He has normal reflexes. A sensory deficit is present. No cranial nerve deficit.       Excellent strength and coordination on the right side. Still with diminished Fine touch. Arm and leg are not allodynic or hypersensitive.  Normal gait.  Psychiatric: He has a normal mood and affect. His behavior is normal. Judgment and thought content normal.          Assessment & Plan:  1. 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 i suspect some of increase in pain is due to the colder weather.  -we will stay with the same neurontin dose  -continue prn ultram for breakthrough pain. -  -needs to work on regular use of the RUE.  -he also needs to work on finding further purpose in his life, distracting activities, family involvement, etc.   2. Left shoulder pain/RTC-  -shoulder moving quite functionally today -continue activity as tolerated.  3. HTN-. Follow up with pcp  4. F/U with me in 6  months. No meds were refilled today.

## 2012-03-13 NOTE — Patient Instructions (Signed)
You need to regularly stretch and use your right arm EACH DAY!  You need to find other activities to distract you from your pain!

## 2012-03-29 MED ORDER — TECHNETIUM TC 99M TETROFOSMIN IV KIT: 10.0000 | PACK | Freq: Once | INTRAVENOUS | Status: DC | PRN

## 2012-03-29 NOTE — Progress Notes (Signed)
Addended by: Haze Boyden on: 03/29/2012 07:32 AM   Modules accepted: Orders

## 2012-04-06 ENCOUNTER — Other Ambulatory Visit: Payer: Self-pay | Admitting: Physical Medicine & Rehabilitation

## 2012-04-15 ENCOUNTER — Other Ambulatory Visit: Payer: Self-pay | Admitting: Physical Medicine & Rehabilitation

## 2012-06-03 DIAGNOSIS — J449 Chronic obstructive pulmonary disease, unspecified: Secondary | ICD-10-CM | POA: Diagnosis not present

## 2012-06-03 DIAGNOSIS — R7301 Impaired fasting glucose: Secondary | ICD-10-CM | POA: Diagnosis not present

## 2012-06-03 DIAGNOSIS — E7800 Pure hypercholesterolemia, unspecified: Secondary | ICD-10-CM | POA: Diagnosis not present

## 2012-06-03 DIAGNOSIS — G8190 Hemiplegia, unspecified affecting unspecified side: Secondary | ICD-10-CM | POA: Diagnosis not present

## 2012-06-08 ENCOUNTER — Other Ambulatory Visit: Payer: Self-pay | Admitting: Physical Medicine & Rehabilitation

## 2012-09-16 ENCOUNTER — Encounter: Payer: Medicare Other | Admitting: Physical Medicine & Rehabilitation

## 2012-09-16 ENCOUNTER — Ambulatory Visit: Payer: Medicare Other | Admitting: Physical Medicine & Rehabilitation

## 2012-09-25 ENCOUNTER — Encounter: Payer: Self-pay | Admitting: Physical Medicine & Rehabilitation

## 2012-09-25 ENCOUNTER — Encounter: Payer: Medicare Other | Attending: Physical Medicine & Rehabilitation | Admitting: Physical Medicine & Rehabilitation

## 2012-09-25 VITALS — BP 134/64 | HR 88 | Resp 18 | Ht 68.0 in | Wt 176.0 lb

## 2012-09-25 DIAGNOSIS — M7582 Other shoulder lesions, left shoulder: Secondary | ICD-10-CM

## 2012-09-25 DIAGNOSIS — I69998 Other sequelae following unspecified cerebrovascular disease: Secondary | ICD-10-CM | POA: Diagnosis not present

## 2012-09-25 DIAGNOSIS — M25519 Pain in unspecified shoulder: Secondary | ICD-10-CM | POA: Diagnosis not present

## 2012-09-25 DIAGNOSIS — R209 Unspecified disturbances of skin sensation: Secondary | ICD-10-CM | POA: Diagnosis not present

## 2012-09-25 DIAGNOSIS — M792 Neuralgia and neuritis, unspecified: Secondary | ICD-10-CM

## 2012-09-25 DIAGNOSIS — IMO0002 Reserved for concepts with insufficient information to code with codable children: Secondary | ICD-10-CM | POA: Diagnosis not present

## 2012-09-25 DIAGNOSIS — M719 Bursopathy, unspecified: Secondary | ICD-10-CM

## 2012-09-25 DIAGNOSIS — I10 Essential (primary) hypertension: Secondary | ICD-10-CM | POA: Diagnosis not present

## 2012-09-25 DIAGNOSIS — M7520 Bicipital tendinitis, unspecified shoulder: Secondary | ICD-10-CM | POA: Diagnosis not present

## 2012-09-25 DIAGNOSIS — R52 Pain, unspecified: Secondary | ICD-10-CM | POA: Diagnosis not present

## 2012-09-25 DIAGNOSIS — I6330 Cerebral infarction due to thrombosis of unspecified cerebral artery: Secondary | ICD-10-CM | POA: Diagnosis not present

## 2012-09-25 DIAGNOSIS — M67919 Unspecified disorder of synovium and tendon, unspecified shoulder: Secondary | ICD-10-CM | POA: Diagnosis not present

## 2012-09-25 MED ORDER — GABAPENTIN 300 MG PO CAPS: 300.0000 mg | ORAL_CAPSULE | Freq: Three times a day (TID) | ORAL | Status: DC

## 2012-09-25 MED ORDER — TRAMADOL HCL 50 MG PO TABS: 50.0000 mg | ORAL_TABLET | Freq: Three times a day (TID) | ORAL | Status: DC | PRN

## 2012-09-25 NOTE — Patient Instructions (Signed)
Biceps Tendon Tendinitis (Distal) with Rehab Tendinitis involves inflammation and pain over the affected tendon. The distal biceps tendon (near the elbow) is vulnerable to tendinitis. Distal biceps tendonitis is usually due to the bony bump near the elbow (bicipital tuberosity) causing increased friction over the tendon. The biceps tendon attaches the biceps muscle to one bone in the elbow and two in the shoulder. It is important for proper function of the elbow and for turning the palm upward (supination). SYMPTOMS   Pain, aching, tenderness, and sometimes warmth or redness over the front of the elbow.  Pain when bending the elbow or turning the palm up, using the wrist, especially if performed against resistance.  Crackling sound (crepitation) when the tendon or elbow is moved or touched. CAUSES  The symptoms of biceps tendonitis are due to inflammation of the tendon. Inflammation may be caused by:  Strain from sudden increase in amount or intensity of activity.  Direct blow or injury to the elbow (uncommon).  Overuse or repetitive elbow bending or wrist rotation, particularly when turning the palm up, or with elbow hyperextension. RISK INCREASES WITH:  Sports that involve contact or overhead arm activity (throwing sports, gymnastics, weightlifting, bodybuilding, rock climbing).  Heavy labor.  Poor strength and flexibility.  Failure to warm up properly before activity.  Injury to other structures of the elbow.  Restraint of the elbow. PREVENTION  Warm up and stretch properly before activity.  Allow time for recovery between activities.  Maintain physical fitness:  Strength, flexibility, and endurance.  Cardiovascular fitness.  Learn and use proper exercise technique. PROGNOSIS  With proper treatment, biceps tendon tendonitis is usually curable within 6 weeks.  RELATED COMPLICATIONS   Longer healing time if not properly treated or if not given enough time to  heal.  Chronically inflamed tendon that causes persistent pain with activity, that may progress to constant pain and potentially rupture of the tendon.  Recurring symptoms, especially if activity is resumed too soon, with overuse or with poor technique. TREATMENT  Treatment first involves ice and medicine to reduce pain and inflammation. Modify activities that cause pain, to reduce the chances of causing the condition to get worse. Strengthening and stretching exercises should be performed to promote proper use of the muscles of the elbow. These exercise may be performed at home or with a therapist. Other treatments may be given such as ultrasound or heat therapy. Surgery is usually not recommended.  MEDICATION  If pain medicine is needed, nonsteroidal anti-inflammatory medicines (aspirin and ibuprofen), or other minor pain relievers (acetaminophen), are often advised.  Do not take pain relieving medication for 7 days before surgery.  Prescription pain relievers may be given if your caregiver thinks they are needed. Use only as directed and only as much as you need. HEAT AND COLD:   Cold treatment (icing) should be applied for 10 to 15 minutes every 2 to 3 hours for inflammation and pain, and immediately after activity that aggravates your symptoms. Use ice packs or an ice massage.  Heat treatment may be used before performing stretching and strengthening activities prescribed by your caregiver, physical therapist, or athletic trainer. Use a heat pack or a warm water soak. SEEK MEDICAL CARE IF:   Symptoms get worse or do not improve in 2 weeks, despite treatment.  New, unexplained symptoms develop. (Drugs used in treatment may produce side effects.) EXERCISES  RANGE OF MOTION (ROM) AND STRETCHING EXERCISES - Biceps Tendon Tendinitis (Distal) These exercises may help you when beginning to   not improve in 2 weeks, despite treatment.  · New, unexplained symptoms develop. (Drugs used in treatment may produce side effects.)  EXERCISES   RANGE OF MOTION (ROM) AND STRETCHING EXERCISES - Biceps Tendon Tendinitis (Distal)  These exercises may help you when beginning to rehabilitate your injury. Your symptoms may go away with or without further involvement from your  physician, physical therapist, or athletic trainer. While completing these exercises, remember:   · Restoring tissue flexibility helps normal motion to return to the joints. This allows healthier, less painful movement and activity.  · An effective stretch should be held for at least 30 seconds.  · A stretch should never be painful. You should only feel a gentle lengthening or release in the stretched tissue.  STRETCH  Elbow Flexors   · Lie on a firm bed or countertop on your back. Be sure that you are in a comfortable position which will allow you to relax your arm muscles.  · Place a folded towel under your right / left upper arm, so that your elbow and shoulder are at the same height. Extend your arm; your elbow should not rest on the bed or towel.  · Allow the weight of your hand to straighten your elbow. Keep your arm and chest muscles relaxed. Your caretaker may ask you to increase the intensity of your stretch by adding a small wrist or hand weight.  · Hold for __________ seconds. You should feel a stretch on the inside of your elbow. Slowly return to the starting position.  Repeat __________ times. Complete this exercise __________ times per day.  RANGE OF MOTION  Supination, Active   · Stand or sit with your elbows at your side. Bend your right / left elbow to 90 degrees.  · Turn your palm upward until you feel a gentle stretch on the inside of your forearm.  · Hold this position for __________ seconds. Slowly release and return to the starting position.  Repeat __________ times. Complete this stretch __________ times per day.   RANGE OF MOTION  Pronation, Active   · Stand or sit with your elbows at your side. Bend your right / left elbow to 90 degrees.  · Turn your palm downward until you feel a gentle stretch on the top of your forearm.  · Hold this position for __________ seconds. Slowly release and return to the starting position.  Repeat __________ times. Complete this stretch __________ times per day.    STRENGTHENING EXERCISES - Biceps Tendon Tendinitis (Distal)  These exercises may help you when beginning to rehabilitate your injury. They may resolve your symptoms with or without further involvement from your physician, physical therapist or athletic trainer. While completing these exercises, remember:   · Muscles can gain both the endurance and the strength needed for everyday activities through controlled exercises.  · Complete these exercises as instructed by your physician, physical therapist or athletic trainer. Increase the resistance and repetitions only as guided.  · You may experience muscle soreness or fatigue, but the pain or discomfort you are trying to eliminate should never get worse during these exercises. If this pain does get worse, stop and make sure you are following the directions exactly. If the pain is still present after adjustments, discontinue the exercise until you can discuss the trouble with your clinician.  STRENGTH - Elbow Flexors, Isometric   · Stand or sit upright on a firm surface. Place your right / left arm so that your hand is palm-up   __________ times. Complete this exercise __________ times per day. STRENGTH  Forearm Supinators   Sit with your right / left forearm supported on a table, keeping your elbow below shoulder height. Rest your hand over the edge, palm down.  Gently grip a hammer or a soup ladle.  Without moving your elbow, slowly turn your palm and hand upward to a "thumbs-up" position.  Hold this position for __________ seconds. Slowly return to the starting  position. Repeat __________ times. Complete this exercise __________ times per day.  STRENGTH  Forearm Pronators   Sit with your right / left forearm supported on a table, keeping your elbow below shoulder height. Rest your hand over the edge, palm up.  Gently grip a hammer or a soup ladle.  Without moving your elbow, slowly turn your palm and hand upward to a "thumbs-up" position.  Hold this position for __________ seconds. Slowly return to the starting position. Repeat __________ times. Complete this exercise __________ times per day.  STRENGTH  Elbow Flexors, Supinated  With good posture, stand or sit on a firm chair without armrests. Allow your right / left arm to rest at your side with your palm facing forward.  Holding a __________ weight, or gripping a rubber exercise band or tubing, bring your hand toward your shoulder.  Allow your muscles to control the resistance as your hand returns to your side. Repeat __________ times. Complete this exercise __________ times per day.  STRENGTH  Elbow Flexors, Neutral  With good posture, stand or sit on a firm chair without armrests. Allow your right / left arm to rest at your side with your thumb facing forward.  Holding a __________ weight, or gripping a rubber exercise band or tubing, bring your hand toward your shoulder.  Allow your muscles to control the resistance as your hand returns to your side. Repeat __________ times. Complete this exercise __________ times per day.  Document Released: 10/16/2005 Document Revised: 01/08/2012 Document Reviewed: 01/28/2009 [facility] Patient Information 2013 [place], [place].

## 2012-09-25 NOTE — Progress Notes (Signed)
Subjective:    Patient ID: Kyle [name], male    DOB: [birth year], 66 y.o.   MRN: [id number]  HPI  Kyle [name] is  Back regarding his CVA.  He comes in today with complaints of right shoulder pain which is worst in the morning and with ROM. If he's been active it will bother him by the end of the day. Overall he reports his pain has improved.  He walks with a cane for balance. He's had no falls.  Pain Inventory Average Pain 8 Pain Right Now 8 My pain is burning  In the last 24 hours, has pain interfered with the following? General activity 2 Relation with others 5 Enjoyment of life 0 What TIME of day is your pain at its worst? daytime Sleep (in general) Fair  Pain is worse with: some activites Pain improves with: rest and medication Relief from Meds: 5  Mobility walk without assistance  Function disabled: date disabled  Do you have any goals in this area?  no  Neuro/Psych No problems in this area  Prior Studies Any changes since last visit?  no  Physicians involved in your care Any changes since last visit?  no   History reviewed. No pertinent family history. History   Social History  . Marital Status: Married    Spouse Name: N/A    Number of Children: N/A  . Years of Education: N/A   Social History Main Topics  . Smoking status: Never Smoker   . Smokeless tobacco: None  . Alcohol Use: No  . Drug Use: No  . Sexually Active: None   Other Topics Concern  . None   Social History Narrative  . None   History reviewed. No pertinent past surgical history. Past Medical History  Diagnosis Date  . Hyperlipidemia    BP 134/64  Pulse 88  Resp 18  Ht 5\' 8"  (1.727 m)  Wt 176 lb (79.833 kg)  BMI 26.76 kg/m2  SpO2 97%    Review of Systems  Musculoskeletal: Positive for myalgias, arthralgias and gait problem.  All other systems reviewed and are negative.       Objective:   Physical Exam Constitutional: He is oriented to person, place, and time. He  appears well-developed and well-nourished.  HENT:  Head: Normocephalic and atraumatic.  Eyes: EOM are normal. Pupils are equal, round, and reactive to light.  Neck: Normal [name] of motion. Neck supple.  Cardiovascular: Normal rate and regular rhythm.  Pulmonary/Chest: Effort normal. No respiratory distress. He has no wheezes.  Abdominal: Bowel sounds are normal. He exhibits no distension. There is no tenderness.  Musculoskeletal:  Right shoulder: He exhibits tenderness. He has pain with palpation of LH and SH bicep tendons on the right. ER/IR non-tender. Impingement maneuvers are negative. Cross-arm and apprehension test are negative. Left shoulder: Normal.  Neurological: He is alert and oriented to person, place, and time. He has normal reflexes. A sensory deficit is present. No cranial nerve deficit.  Excellent strength and coordination on the right side. Still with diminished Fine touch. Arm and leg are not allodynic or hypersensitive.  Normal gait.  Psychiatric: He has a normal mood and affect. His behavior is normal. Judgment and thought content normal.    Assessment & Plan:   1. 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   -we will stay with the same neurontin dose  -continue prn ultram for breakthrough pain -needs to participate in sensible  activites to promote strength and ROM.  2. Left shoulder pain/RTC- appears to have more  Bicipital signs today. -I don't think he needs injections -pursue appropriate ROM, rest, exercise -needs to avoid overuse    3. HTN-. Follow up with pcp  4. F/U with me in 6 months. No meds were refilled today

## 2012-09-30 ENCOUNTER — Other Ambulatory Visit: Payer: Self-pay | Admitting: Physical Medicine & Rehabilitation

## 2012-10-11 ENCOUNTER — Other Ambulatory Visit: Payer: Self-pay | Admitting: *Deleted

## 2012-10-11 DIAGNOSIS — I6330 Cerebral infarction due to thrombosis of unspecified cerebral artery: Secondary | ICD-10-CM

## 2012-10-11 DIAGNOSIS — M7582 Other shoulder lesions, left shoulder: Secondary | ICD-10-CM

## 2012-10-11 DIAGNOSIS — M7520 Bicipital tendinitis, unspecified shoulder: Secondary | ICD-10-CM

## 2012-10-11 DIAGNOSIS — M792 Neuralgia and neuritis, unspecified: Secondary | ICD-10-CM

## 2012-10-11 MED ORDER — GABAPENTIN 300 MG PO CAPS: 300.0000 mg | ORAL_CAPSULE | Freq: Three times a day (TID) | ORAL | Status: DC

## 2012-10-11 MED ORDER — TRAMADOL HCL 50 MG PO TABS: 50.0000 mg | ORAL_TABLET | Freq: Three times a day (TID) | ORAL | Status: DC | PRN

## 2012-12-06 ENCOUNTER — Other Ambulatory Visit: Payer: Self-pay | Admitting: Physical Medicine & Rehabilitation

## 2012-12-16 DIAGNOSIS — G8190 Hemiplegia, unspecified affecting unspecified side: Secondary | ICD-10-CM | POA: Diagnosis not present

## 2012-12-16 DIAGNOSIS — E7800 Pure hypercholesterolemia, unspecified: Secondary | ICD-10-CM | POA: Diagnosis not present

## 2012-12-16 DIAGNOSIS — Z1331 Encounter for screening for depression: Secondary | ICD-10-CM | POA: Diagnosis not present

## 2012-12-16 DIAGNOSIS — Z23 Encounter for immunization: Secondary | ICD-10-CM | POA: Diagnosis not present

## 2013-01-13 DIAGNOSIS — D126 Benign neoplasm of colon, unspecified: Secondary | ICD-10-CM | POA: Diagnosis not present

## 2013-01-13 DIAGNOSIS — K6389 Other specified diseases of intestine: Secondary | ICD-10-CM | POA: Diagnosis not present

## 2013-01-13 DIAGNOSIS — Z1211 Encounter for screening for malignant neoplasm of colon: Secondary | ICD-10-CM | POA: Diagnosis not present

## 2013-03-25 ENCOUNTER — Encounter: Payer: Medicare Other | Admitting: Physical Medicine & Rehabilitation

## 2013-03-31 ENCOUNTER — Other Ambulatory Visit: Payer: Self-pay | Admitting: Physical Medicine & Rehabilitation

## 2013-04-04 ENCOUNTER — Encounter: Payer: Medicare Other | Attending: Physical Medicine & Rehabilitation | Admitting: Physical Medicine & Rehabilitation

## 2013-04-04 ENCOUNTER — Encounter: Payer: Self-pay | Admitting: Physical Medicine & Rehabilitation

## 2013-04-04 VITALS — BP 143/70 | HR 77 | Resp 14 | Ht 68.0 in | Wt 177.0 lb

## 2013-04-04 DIAGNOSIS — R209 Unspecified disturbances of skin sensation: Secondary | ICD-10-CM | POA: Diagnosis not present

## 2013-04-04 DIAGNOSIS — M792 Neuralgia and neuritis, unspecified: Secondary | ICD-10-CM

## 2013-04-04 DIAGNOSIS — M719 Bursopathy, unspecified: Secondary | ICD-10-CM | POA: Insufficient documentation

## 2013-04-04 DIAGNOSIS — IMO0002 Reserved for concepts with insufficient information to code with codable children: Secondary | ICD-10-CM | POA: Diagnosis not present

## 2013-04-04 DIAGNOSIS — M79609 Pain in unspecified limb: Secondary | ICD-10-CM | POA: Diagnosis not present

## 2013-04-04 DIAGNOSIS — M7520 Bicipital tendinitis, unspecified shoulder: Secondary | ICD-10-CM | POA: Diagnosis not present

## 2013-04-04 DIAGNOSIS — I10 Essential (primary) hypertension: Secondary | ICD-10-CM | POA: Diagnosis not present

## 2013-04-04 DIAGNOSIS — M67919 Unspecified disorder of synovium and tendon, unspecified shoulder: Secondary | ICD-10-CM | POA: Insufficient documentation

## 2013-04-04 DIAGNOSIS — M25519 Pain in unspecified shoulder: Secondary | ICD-10-CM | POA: Insufficient documentation

## 2013-04-04 DIAGNOSIS — M7521 Bicipital tendinitis, right shoulder: Secondary | ICD-10-CM

## 2013-04-04 DIAGNOSIS — I6330 Cerebral infarction due to thrombosis of unspecified cerebral artery: Secondary | ICD-10-CM

## 2013-04-04 DIAGNOSIS — I69998 Other sequelae following unspecified cerebrovascular disease: Secondary | ICD-10-CM | POA: Diagnosis not present

## 2013-04-04 DIAGNOSIS — M7582 Other shoulder lesions, left shoulder: Secondary | ICD-10-CM

## 2013-04-04 MED ORDER — SIMVASTATIN 20 MG PO TABS: 20.0000 mg | ORAL_TABLET | Freq: Every evening | ORAL | Status: DC

## 2013-04-04 MED ORDER — TRAMADOL HCL 50 MG PO TABS: ORAL_TABLET | ORAL | Status: DC

## 2013-04-04 MED ORDER — GABAPENTIN 300 MG PO CAPS: ORAL_CAPSULE | ORAL | Status: DC

## 2013-04-04 NOTE — Progress Notes (Signed)
Subjective:    Patient ID: Kyle [name], male    DOB: Oct 12, 1946, 67 y.o.   MRN: [id number]  HPI  Mr. Merida is back regarding his CVA and associated pain. He continues to have pain in his shoulder as well as the right foot/leg. He uses ultram for the pain which helps. The shoulder is more aching while the leg/foot is more tingling in guality.   Pain Inventory Average Pain 7 Pain Right Now 7 My pain is intermittent  In the last 24 hours, has pain interfered with the following? General activity 5 Relation with others 5 Enjoyment of life 5 What TIME of day is your pain at its worst? daytime Sleep (in general) Good  Pain is worse with: walking, bending and some activites Pain improves with: rest and medication Relief from Meds: 6  Mobility use a cane how many minutes can you walk? 2 hours ability to climb steps?  yes do you drive?  no Do you have any goals in this area?  no  Function disabled: date disabled . Do you have any goals in this area?  no  Neuro/Psych numbness  Prior Studies Any changes since last visit?  no  Physicians involved in your care Any changes since last visit?  no   History reviewed. No pertinent family history. History   Social History  . Marital Status: Married    Spouse Name: N/A    Number of Children: N/A  . Years of Education: N/A   Social History Main Topics  . Smoking status: Never Smoker   . Smokeless tobacco: None  . Alcohol Use: No  . Drug Use: No  . Sexually Active: None   Other Topics Concern  . None   Social History Narrative  . None   History reviewed. No pertinent past surgical history. Past Medical History  Diagnosis Date  . Hyperlipidemia   . Stroke    BP 143/70  Pulse 77  Resp 14  Ht 5\' 8"  (1.727 m)  Wt 177 lb (80.287 kg)  BMI 26.92 kg/m2  SpO2 93%     Review of Systems  Neurological: Positive for numbness.  All other systems reviewed and are negative.       Objective:   Physical  Exam  Constitutional: He is oriented to person, place, and time. He appears well-developed and well-nourished.  HENT:  Head: Normocephalic and atraumatic.  Eyes: EOM are normal. Pupils are equal, round, and reactive to light.  Neck: Normal [name] of motion. Neck supple.  Cardiovascular: Normal rate and regular rhythm.  Pulmonary/Chest: Effort normal. No respiratory distress. He has no wheezes.  Abdominal: Bowel sounds are normal. He exhibits no distension. There is no tenderness.  Musculoskeletal:  Right shoulder: He exhibits tenderness. He has pain with palpation of LH  bicep tendon on the right. Speed's maneuver was equivocal.  ER/IR non-tender. Impingement maneuvers are negative. Cross-arm and apprehension test are negative. Left shoulder: Normal.  Neurological: He is alert and oriented to person, place, and time. He has normal reflexes. A sensory deficit is present. No cranial nerve deficit.  Excellent strength and coordination on the right side. Still with diminished Fine touch. Arm and leg are not allodynic or hypersensitive.  Normal gait.  Psychiatric: He has a normal mood and affect. His behavior is normal. Judgment and thought content normal.  Assessment & Plan:   1. 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  -we will stay with  the same neurontin dose  -continue prn ultram for breakthrough pain  -needs to participate in sensible activites to promote strength and ROM. DISCUSSED THE USE OF ICE AS WELL 2. RIGHT shoulder pain/RTC- appears to have more Bicipital signs once again today.  -After informed consent and preparation of the skin with betadine and isopropyl alcohol, I injected 40mg  of methylprednisolone and 4cc of 1% lidocaine around the long head biceps tendon via anterior approach. The patient tolerated well, and no complications were encountered. Afterward the area was cleaned and dressed. Post- injection  instructions were provided.  -pursue appropriate ROM, rest, exercise  -needs to avoid overuse  3. HTN-. Follow up with pcp --HE NEEDS A PCP! 4. F/U with me in 6 months.

## 2013-04-04 NOTE — Patient Instructions (Signed)
YOU NEED TO TAKE YOUR CHOLESTEROL MEDICATION!!!

## 2013-06-16 DIAGNOSIS — G8310 Monoplegia of lower limb affecting unspecified side: Secondary | ICD-10-CM | POA: Diagnosis not present

## 2013-06-16 DIAGNOSIS — J449 Chronic obstructive pulmonary disease, unspecified: Secondary | ICD-10-CM | POA: Diagnosis not present

## 2013-06-16 DIAGNOSIS — E7800 Pure hypercholesterolemia, unspecified: Secondary | ICD-10-CM | POA: Diagnosis not present

## 2013-06-16 DIAGNOSIS — K219 Gastro-esophageal reflux disease without esophagitis: Secondary | ICD-10-CM | POA: Diagnosis not present

## 2013-06-17 ENCOUNTER — Other Ambulatory Visit: Payer: Self-pay | Admitting: Physical Medicine & Rehabilitation

## 2013-09-12 ENCOUNTER — Other Ambulatory Visit: Payer: Self-pay | Admitting: Physical Medicine & Rehabilitation

## 2013-09-12 NOTE — Telephone Encounter (Signed)
Patient needs a refill on tramadol please call niece at [phone]

## 2013-10-03 ENCOUNTER — Encounter: Payer: Medicare Other | Attending: Physical Medicine & Rehabilitation | Admitting: Physical Medicine & Rehabilitation

## 2013-10-03 ENCOUNTER — Encounter: Payer: Self-pay | Admitting: Physical Medicine & Rehabilitation

## 2013-10-03 VITALS — BP 128/82 | HR 81 | Resp 16 | Ht 68.0 in | Wt 179.0 lb

## 2013-10-03 DIAGNOSIS — M67919 Unspecified disorder of synovium and tendon, unspecified shoulder: Secondary | ICD-10-CM

## 2013-10-03 DIAGNOSIS — R209 Unspecified disturbances of skin sensation: Secondary | ICD-10-CM | POA: Insufficient documentation

## 2013-10-03 DIAGNOSIS — I6330 Cerebral infarction due to thrombosis of unspecified cerebral artery: Secondary | ICD-10-CM

## 2013-10-03 DIAGNOSIS — R52 Pain, unspecified: Secondary | ICD-10-CM | POA: Diagnosis not present

## 2013-10-03 DIAGNOSIS — R269 Unspecified abnormalities of gait and mobility: Secondary | ICD-10-CM | POA: Diagnosis not present

## 2013-10-03 DIAGNOSIS — I10 Essential (primary) hypertension: Secondary | ICD-10-CM | POA: Diagnosis not present

## 2013-10-03 DIAGNOSIS — IMO0002 Reserved for concepts with insufficient information to code with codable children: Secondary | ICD-10-CM | POA: Diagnosis not present

## 2013-10-03 DIAGNOSIS — M792 Neuralgia and neuritis, unspecified: Secondary | ICD-10-CM

## 2013-10-03 DIAGNOSIS — I69998 Other sequelae following unspecified cerebrovascular disease: Secondary | ICD-10-CM | POA: Insufficient documentation

## 2013-10-03 DIAGNOSIS — M25519 Pain in unspecified shoulder: Secondary | ICD-10-CM | POA: Insufficient documentation

## 2013-10-03 DIAGNOSIS — M7521 Bicipital tendinitis, right shoulder: Secondary | ICD-10-CM

## 2013-10-03 DIAGNOSIS — M7520 Bicipital tendinitis, unspecified shoulder: Secondary | ICD-10-CM

## 2013-10-03 DIAGNOSIS — M7581 Other shoulder lesions, right shoulder: Secondary | ICD-10-CM

## 2013-10-03 MED ORDER — TRAMADOL HCL 50 MG PO TABS: ORAL_TABLET | ORAL | Status: DC

## 2013-10-03 NOTE — Patient Instructions (Signed)
YOU NEED TO FIND A FAMILY DOCTOR!!!!!  CONTINUE WITH REASONABLE ACTIVITIES AND STRETCHING FOR YOUR RIGHT SHOULDER!!

## 2013-10-03 NOTE — Progress Notes (Signed)
Subjective:    Patient ID: Kyle [name], male    DOB: [birth year], 67 y.o.   MRN: [id number]  HPI  Mr. Ponciano is back regarding his prior cva and associated pain. He still complains of pain especially in the afternoon. He is on gabapentin TID and ultram prn. He doesn't take the tramadol daily, but just when the pain is worse----the tramadol is effective  He participates in regular exercises at home to stretch the right arm.   Pain Inventory Average Pain 10 Pain Right Now 10 My pain is burning  In the last 24 hours, has pain interfered with the following? General activity 10 Relation with others 8 Enjoyment of life 1 What TIME of day is your pain at its worst? daytime Sleep (in general) Fair  Pain is worse with: walking Pain improves with: injections Relief from Meds: n/a  Mobility walk without assistance Do you have any goals in this area?  no  Function disabled: date disabled . Do you have any goals in this area?  no  Neuro/Psych trouble walking  Prior Studies Any changes since last visit?  no  Physicians involved in your care Any changes since last visit?  no   History reviewed. No pertinent family history. History   Social History  . Marital Status: Married    Spouse Name: N/A    Number of Children: N/A  . Years of Education: N/A   Social History Main Topics  . Smoking status: Never Smoker   . Smokeless tobacco: None  . Alcohol Use: No  . Drug Use: No  . Sexual Activity: None   Other Topics Concern  . None   Social History Narrative  . None   History reviewed. No pertinent past surgical history. Past Medical History  Diagnosis Date  . Hyperlipidemia   . Stroke    BP 128/82  Pulse 81  Resp 16  Ht 5\' 8"  (1.727 m)  Wt 179 lb (81.194 kg)  BMI 27.22 kg/m2  SpO2 94%     Review of Systems  Musculoskeletal: Positive for arthralgias, gait problem and myalgias.  All other systems reviewed and are negative.       Objective:   Physical  Exam  Constitutional: He is oriented to person, place, and time. He appears well-developed and well-nourished.  HENT:  Head: Normocephalic and atraumatic.  Eyes: EOM are normal. Pupils are equal, round, and reactive to light.  Neck: Normal [name] of motion. Neck supple.  Cardiovascular: Normal rate and regular rhythm.  Pulmonary/Chest: Effort normal. No respiratory distress. He has no wheezes.  Abdominal: Bowel sounds are normal. He exhibits no distension. There is no tenderness.  Musculoskeletal:  Right shoulder: He exhibits  Minimal tenderness. With ER, IR and rtc and bicep maneuvers. Left shoulder: Normal.  Neurological: He is alert and oriented to person, place, and time. He has normal reflexes. A sensory deficit is present right arm and leg.Marland Kitchen No cranial nerve deficit.  Excellent strength and coordination on the right side. Still with diminished Fine touch. Arm and leg are not allodynic or hypersensitive. Gait somewhat disjointed on the right side but stable otherwise.  Psychiatric: He has a normal mood and affect. His behavior is normal. Judgment and thought content normal.   Assessment & Plan:   1. 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  -we will stay with the same neurontin dose  -continue prn ultram for breakthrough pain. This was refilled today.  2. RIGHT shoulder pain/RTC- minimal symptoms at this point.    -continue appropriate ROM, rest, exercise, and reasonable use of arm!     3. HTN-. Improved. He still needs a PCP!  4. F/U with me in 6 months.

## 2013-11-24 ENCOUNTER — Other Ambulatory Visit: Payer: Self-pay | Admitting: Physical Medicine & Rehabilitation

## 2013-12-08 DIAGNOSIS — R059 Cough, unspecified: Secondary | ICD-10-CM | POA: Diagnosis not present

## 2013-12-08 DIAGNOSIS — J449 Chronic obstructive pulmonary disease, unspecified: Secondary | ICD-10-CM | POA: Diagnosis not present

## 2013-12-08 DIAGNOSIS — G8310 Monoplegia of lower limb affecting unspecified side: Secondary | ICD-10-CM | POA: Diagnosis not present

## 2013-12-08 DIAGNOSIS — R05 Cough: Secondary | ICD-10-CM | POA: Diagnosis not present

## 2013-12-08 DIAGNOSIS — Z23 Encounter for immunization: Secondary | ICD-10-CM | POA: Diagnosis not present

## 2013-12-08 DIAGNOSIS — E7800 Pure hypercholesterolemia, unspecified: Secondary | ICD-10-CM | POA: Diagnosis not present

## 2014-03-30 ENCOUNTER — Encounter: Payer: Medicare Other | Attending: Physical Medicine & Rehabilitation | Admitting: Physical Medicine & Rehabilitation

## 2014-03-30 ENCOUNTER — Encounter: Payer: Self-pay | Admitting: Physical Medicine & Rehabilitation

## 2014-03-30 VITALS — BP 140/64 | HR 79 | Resp 14 | Ht 66.0 in | Wt 175.0 lb

## 2014-03-30 DIAGNOSIS — G8929 Other chronic pain: Secondary | ICD-10-CM | POA: Insufficient documentation

## 2014-03-30 DIAGNOSIS — I10 Essential (primary) hypertension: Secondary | ICD-10-CM | POA: Insufficient documentation

## 2014-03-30 DIAGNOSIS — M7581 Other shoulder lesions, right shoulder: Secondary | ICD-10-CM

## 2014-03-30 DIAGNOSIS — I639 Cerebral infarction, unspecified: Secondary | ICD-10-CM

## 2014-03-30 DIAGNOSIS — E785 Hyperlipidemia, unspecified: Secondary | ICD-10-CM | POA: Insufficient documentation

## 2014-03-30 DIAGNOSIS — M719 Bursopathy, unspecified: Secondary | ICD-10-CM | POA: Diagnosis not present

## 2014-03-30 DIAGNOSIS — I69998 Other sequelae following unspecified cerebrovascular disease: Secondary | ICD-10-CM | POA: Insufficient documentation

## 2014-03-30 DIAGNOSIS — M25519 Pain in unspecified shoulder: Secondary | ICD-10-CM | POA: Diagnosis not present

## 2014-03-30 DIAGNOSIS — M67919 Unspecified disorder of synovium and tendon, unspecified shoulder: Secondary | ICD-10-CM

## 2014-03-30 DIAGNOSIS — I6350 Cerebral infarction due to unspecified occlusion or stenosis of unspecified cerebral artery: Secondary | ICD-10-CM

## 2014-03-30 DIAGNOSIS — M7520 Bicipital tendinitis, unspecified shoulder: Secondary | ICD-10-CM

## 2014-03-30 DIAGNOSIS — M792 Neuralgia and neuritis, unspecified: Secondary | ICD-10-CM

## 2014-03-30 DIAGNOSIS — R209 Unspecified disturbances of skin sensation: Principal | ICD-10-CM | POA: Insufficient documentation

## 2014-03-30 DIAGNOSIS — IMO0002 Reserved for concepts with insufficient information to code with codable children: Secondary | ICD-10-CM | POA: Diagnosis not present

## 2014-03-30 MED ORDER — GABAPENTIN 300 MG PO CAPS: ORAL_CAPSULE | ORAL | Status: DC

## 2014-03-30 MED ORDER — TRAMADOL HCL 50 MG PO TABS: ORAL_TABLET | ORAL | Status: DC

## 2014-03-30 NOTE — Patient Instructions (Signed)
PLEASE CALL ME WITH ANY PROBLEMS OR QUESTIONS (#[phone]).      

## 2014-03-30 NOTE — Progress Notes (Signed)
Subjective:    Patient ID: Kyle [name], male    DOB: December 22, 1945, 68 y.o.   MRN: [id number]  HPI  Kyle [name] is back regarding his cva and chronic pain. He has been doing quite well. His right leg still has sensory loss but the pain is controlled. He is using the gabapentin TID and takes an occasional ultram at night. He does his right shouder exercises daily. He uses a straight cane for support.   Pain Inventory Average Pain 6 Pain Right Now 6 My pain is intermittent and sharp  In the last 24 hours, has pain interfered with the following? General activity 6 Relation with others 6 Enjoyment of life 6 What TIME of day is your pain at its worst? morning Sleep (in general) Fair  Pain is worse with: walking Pain improves with: heat/ice Relief from Meds: 5  Mobility walk with assistance use a cane ability to climb steps?  yes do you drive?  no transfers alone Do you have any goals in this area?  no  Function disabled: date disabled na Do you have any goals in this area?  no  Neuro/Psych weakness  Prior Studies Any changes since last visit?  no  Physicians involved in your care Any changes since last visit?  no   History reviewed. No pertinent family history. History   Social History  . Marital Status: Married    Spouse Name: N/A    Number of Children: N/A  . Years of Education: N/A   Social History Main Topics  . Smoking status: Never Smoker   . Smokeless tobacco: None  . Alcohol Use: No  . Drug Use: No  . Sexual Activity: None   Other Topics Concern  . None   Social History Narrative  . None   History reviewed. No pertinent past surgical history. Past Medical History  Diagnosis Date  . Hyperlipidemia   . Stroke    BP 140/64  Pulse 79  Resp 14  Ht 5\' 6"  (1.676 m)  Wt 175 lb (79.379 kg)  BMI 28.26 kg/m2  SpO2 92%  Opioid Risk Score:   Fall Risk Score: Moderate Fall Risk (6-13 points) (pt educated on fall risk, brochure given to  pt)   Review of Systems  Neurological: Positive for weakness.  All other systems reviewed and are negative.      Objective:   Physical Exam  Constitutional: He is oriented to person, place, and time. He appears well-developed and well-nourished.  HENT:  Head: Normocephalic and atraumatic.  Eyes: EOM are normal. Pupils are equal, round, and reactive to light.  Neck: Normal range of motion. Neck supple.  Cardiovascular: Normal rate and regular rhythm.  Pulmonary/Chest: Effort normal. No respiratory distress. He has no wheezes.  Abdominal: Bowel sounds are normal. He exhibits no distension. There is no tenderness.  Musculoskeletal:  Right shoulder: He exhibits no tenderness with ER, IR and rtc and bicep maneuvers. Left shoulder: Normal.  Neurological: He is alert and oriented to person, place, and time. He has normal reflexes. A sensory deficit is present right arm and leg. No cranial nerve deficit.  Excellent strength and coordination on the right side. Still with diminished Fine touch. Arm and leg are not allodynic or hypersensitive. Gait is stable. He still throws the right leg out in front of him during swing phase but he's stable in stance without recurvatum.   Psychiatric: He has a normal mood and affect. His behavior is normal. Judgment and thought content normal.  Assessment & Plan:   1. 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 He typically takes this at night time only.  2. RIGHT shoulder pain/RTC- doing well. .  -continue appropriate ROM, rest, exercise, 3. HTN-. Improved.  Follow up per routine 4. F/U with me in 12 months. Marland Kitchen

## 2014-06-08 DIAGNOSIS — I6990 Unspecified sequelae of unspecified cerebrovascular disease: Secondary | ICD-10-CM | POA: Diagnosis not present

## 2014-06-08 DIAGNOSIS — Z1331 Encounter for screening for depression: Secondary | ICD-10-CM | POA: Diagnosis not present

## 2014-06-08 DIAGNOSIS — J449 Chronic obstructive pulmonary disease, unspecified: Secondary | ICD-10-CM | POA: Diagnosis not present

## 2014-06-08 DIAGNOSIS — E7800 Pure hypercholesterolemia, unspecified: Secondary | ICD-10-CM | POA: Diagnosis not present

## 2014-06-08 DIAGNOSIS — G8310 Monoplegia of lower limb affecting unspecified side: Secondary | ICD-10-CM | POA: Diagnosis not present

## 2014-06-09 ENCOUNTER — Other Ambulatory Visit: Payer: Self-pay | Admitting: Physical Medicine & Rehabilitation

## 2014-11-03 ENCOUNTER — Other Ambulatory Visit: Payer: Self-pay | Admitting: Physical Medicine & Rehabilitation

## 2014-11-03 NOTE — Telephone Encounter (Signed)
Pt is a one year f/u stroke pt, refill request sent through interface, rx sent electronically to pt's preferred pharmacy, attempted to contact pt, phone disconnected

## 2014-12-02 ENCOUNTER — Other Ambulatory Visit: Payer: Self-pay | Admitting: Physical Medicine & Rehabilitation

## 2014-12-03 ENCOUNTER — Other Ambulatory Visit: Payer: Self-pay | Admitting: Physical Medicine & Rehabilitation

## 2014-12-07 DIAGNOSIS — I6930 Unspecified sequelae of cerebral infarction: Secondary | ICD-10-CM | POA: Diagnosis not present

## 2014-12-07 DIAGNOSIS — E780 Pure hypercholesterolemia: Secondary | ICD-10-CM | POA: Diagnosis not present

## 2014-12-07 DIAGNOSIS — J449 Chronic obstructive pulmonary disease, unspecified: Secondary | ICD-10-CM | POA: Diagnosis not present

## 2014-12-07 DIAGNOSIS — Z23 Encounter for immunization: Secondary | ICD-10-CM | POA: Diagnosis not present

## 2014-12-07 DIAGNOSIS — Z1389 Encounter for screening for other disorder: Secondary | ICD-10-CM | POA: Diagnosis not present

## 2014-12-07 DIAGNOSIS — M25511 Pain in right shoulder: Secondary | ICD-10-CM | POA: Diagnosis not present

## 2014-12-07 DIAGNOSIS — G8191 Hemiplegia, unspecified affecting right dominant side: Secondary | ICD-10-CM | POA: Diagnosis not present

## 2015-02-01 ENCOUNTER — Encounter: Payer: Self-pay | Admitting: Physical Medicine & Rehabilitation

## 2015-04-05 ENCOUNTER — Encounter: Payer: Medicare Other | Admitting: Physical Medicine & Rehabilitation

## 2015-04-19 ENCOUNTER — Other Ambulatory Visit: Payer: Self-pay | Admitting: *Deleted

## 2015-04-19 MED ORDER — GABAPENTIN 300 MG PO CAPS: ORAL_CAPSULE | ORAL | Status: DC

## 2015-04-26 ENCOUNTER — Encounter: Payer: Medicare Other | Attending: Physical Medicine & Rehabilitation | Admitting: Physical Medicine & Rehabilitation

## 2015-04-26 ENCOUNTER — Encounter: Payer: Self-pay | Admitting: Physical Medicine & Rehabilitation

## 2015-04-26 VITALS — BP 135/73 | HR 77 | Resp 16

## 2015-04-26 DIAGNOSIS — M7581 Other shoulder lesions, right shoulder: Secondary | ICD-10-CM | POA: Diagnosis not present

## 2015-04-26 DIAGNOSIS — M792 Neuralgia and neuritis, unspecified: Secondary | ICD-10-CM | POA: Insufficient documentation

## 2015-04-26 DIAGNOSIS — M7521 Bicipital tendinitis, right shoulder: Secondary | ICD-10-CM | POA: Diagnosis not present

## 2015-04-26 DIAGNOSIS — I6330 Cerebral infarction due to thrombosis of unspecified cerebral artery: Secondary | ICD-10-CM | POA: Insufficient documentation

## 2015-04-26 NOTE — Patient Instructions (Signed)
YOU NEED TO TAKE YOUR WIFE TO THE [place]!!!!!

## 2015-04-26 NOTE — Progress Notes (Signed)
Subjective:    Patient ID: Kyle [name], male    DOB: 1946-02-03, 69 y.o.   MRN: [id number]  HPI  Kyle [name] is here in folllow up of his left PCA infarct. He reports that he began to have tingling and numbness in the right hand in February. The pain worsened without any obvious cause. He has continued taking his gabapentin 300mg  TID as rx'ed. His pain has worsened however in the right shoulder. This pain tends to radiated from the shoulder to the elbow. It worsens with activities over head.  He has experienced no new medical problems. BP has been controlled.   Pain Inventory Average Pain 8 Pain Right Now 0 My pain is tingling  In the last 24 hours, has pain interfered with the following? General activity 0 Relation with others 0 Enjoyment of life 0 What TIME of day is your pain at its worst? daytime Sleep (in general) NA  Pain is worse with: unsure Pain improves with: medication Relief from Meds: not answered  Mobility use a cane ability to climb steps?  yes do you drive?  no  Function retired  Neuro/Psych tingling  Prior Studies Any changes since last visit?  no  Physicians involved in your care Any changes since last visit?  no   History reviewed. No pertinent family history. History   Social History  . Marital Status: Married    Spouse Name: N/A  . Number of Children: N/A  . Years of Education: N/A   Social History Main Topics  . Smoking status: Never Smoker   . Smokeless tobacco: Not on file  . Alcohol Use: No  . Drug Use: No  . Sexual Activity: Not on file   Other Topics Concern  . None   Social History Narrative   History reviewed. No pertinent past surgical history. Past Medical History  Diagnosis Date  . Hyperlipidemia   . Stroke    BP 135/73 mmHg  Pulse 77  Resp 16  SpO2 94%  Opioid Risk Score:   Fall Risk Score: Low Fall Risk (0-5 points)`1  Depression screen PHQ 2/9  Depression screen PHQ 2/9 04/26/2015  Decreased Interest 0    Down, Depressed, Hopeless 0  PHQ - 2 Score 0  Altered sleeping 0  Tired, decreased energy 0  Change in appetite 0  Feeling bad or failure about yourself  0  Trouble concentrating 0  Moving slowly or fidgety/restless 0  Suicidal thoughts 0  PHQ-9 Score 0    Review of Systems  Neurological:       Tingling  All other systems reviewed and are negative.      Objective:   Physical Exam  Constitutional: He is oriented to person, place, and time. He appears well-developed and well-nourished.  HENT:  Head: Normocephalic and atraumatic.  Eyes: EOM are normal. Pupils are equal, round, and reactive to light.  Neck: Normal range of motion. Neck supple.  Cardiovascular: Normal rate and regular rhythm.  Pulmonary/Chest: Effort normal. No respiratory distress. He has no wheezes.  Abdominal: Bowel sounds are normal. He exhibits no distension. There is no tenderness.  Musculoskeletal:  Right shoulder: He exhibits mild to moderate tenderness over subacromial space and subdeltoid space as well as with ER, IR and rtc maneuvers. Left biceps tendon very tender to touch. . Left shoulder: Normal.  Neurological: He is alert and oriented to person, place, and time. He has normal reflexes. A sensory deficit is present right arm and leg. No cranial nerve deficit.  Tinel's negative at elbow.  Excellent strength and coordination on the right side. Still with diminished Fine touch. Arm and leg are not allodynic or hypersensitive. Gait is stable. He is still clumsy with gait on the right side.   Psychiatric: He has a normal mood and affect. His behavior is normal. Judgment and thought content normal.   Assessment & Plan:   1. 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  2. RIGHT shoulder pain/RTC-  doing well--more short head biceps on exam today.  -encouraged ice, rest, ROM  -After informed consent and preparation of the skin with betadine and isopropyl alcohol, I injected 6mg  (1cc) of celestone and 4cc of 1% lidocaine around the left short biceps tendon via anterior approach. Additionally, aspiration was performed prior to injection. The patient tolerated well, and no complications were encountered. Afterward the area was cleaned and dressed. Post- injection instructions were provided.   3. HTN-. Improved. Follow up per routine  4. F/U with me in 2 months. Fifteen minutes of face to face patient care time were spent during this visit. All questions were encouraged and answered.

## 2015-05-24 DIAGNOSIS — J449 Chronic obstructive pulmonary disease, unspecified: Secondary | ICD-10-CM | POA: Diagnosis not present

## 2015-05-24 DIAGNOSIS — I6930 Unspecified sequelae of cerebral infarction: Secondary | ICD-10-CM | POA: Diagnosis not present

## 2015-05-24 DIAGNOSIS — E780 Pure hypercholesterolemia: Secondary | ICD-10-CM | POA: Diagnosis not present

## 2015-06-17 ENCOUNTER — Other Ambulatory Visit: Payer: Self-pay | Admitting: Physical Medicine & Rehabilitation

## 2015-06-18 ENCOUNTER — Ambulatory Visit: Payer: Medicare Other | Admitting: Physical Medicine & Rehabilitation

## 2015-06-29 ENCOUNTER — Encounter: Payer: Medicare Other | Attending: Physical Medicine & Rehabilitation | Admitting: Physical Medicine & Rehabilitation

## 2015-06-29 ENCOUNTER — Encounter: Payer: Self-pay | Admitting: Physical Medicine & Rehabilitation

## 2015-06-29 VITALS — BP 145/79 | HR 85 | Resp 14

## 2015-06-29 DIAGNOSIS — M792 Neuralgia and neuritis, unspecified: Secondary | ICD-10-CM

## 2015-06-29 DIAGNOSIS — M7521 Bicipital tendinitis, right shoulder: Secondary | ICD-10-CM | POA: Insufficient documentation

## 2015-06-29 DIAGNOSIS — M7581 Other shoulder lesions, right shoulder: Secondary | ICD-10-CM | POA: Diagnosis not present

## 2015-06-29 DIAGNOSIS — I6330 Cerebral infarction due to thrombosis of unspecified cerebral artery: Secondary | ICD-10-CM

## 2015-06-29 NOTE — Progress Notes (Signed)
Subjective:    Patient ID: Kyle [name], male    DOB: 12-11-1945, 69 y.o.   MRN: [id number]  HPI   Mr. Henes is here in follow up of his ICH and chronic right sided pain. He had good results with the right shoulder injection 2 months ago. He had relief with the injection although he has had some increase in pain over the last few weeks.   His bp has been under better control and he hasn't had any new medical issues.   Pain Inventory Average Pain 5 Pain Right Now 5 My pain is tingling  In the last 24 hours, has pain interfered with the following? General activity 5 Relation with others 0 Enjoyment of life 0 What TIME of day is your pain at its worst? evening Sleep (in general) Good  Pain is worse with: some activites Pain improves with: medication Relief from Meds: 7  Mobility walk with assistance use a cane  Function retired  Neuro/Psych tingling  Prior Studies Any changes since last visit?  no  Physicians involved in your care Any changes since last visit?  no   History reviewed. No pertinent family history. Social History   Social History  . Marital Status: Married    Spouse Name: N/A  . Number of Children: N/A  . Years of Education: N/A   Social History Main Topics  . Smoking status: Never Smoker   . Smokeless tobacco: None  . Alcohol Use: No  . Drug Use: No  . Sexual Activity: Not Asked   Other Topics Concern  . None   Social History Narrative   History reviewed. No pertinent past surgical history. Past Medical History  Diagnosis Date  . Hyperlipidemia   . Stroke    BP 145/79 mmHg  Pulse 85  Resp 14  SpO2 93%  Opioid Risk Score:   Fall Risk Score:  `1  Depression screen PHQ 2/9  Depression screen PHQ 2/9 04/26/2015  Decreased Interest 0  Down, Depressed, Hopeless 0  PHQ - 2 Score 0  Altered sleeping 0  Tired, decreased energy 0  Change in appetite 0  Feeling bad or failure about yourself  0  Trouble concentrating 0  Moving  slowly or fidgety/restless 0  Suicidal thoughts 0  PHQ-9 Score 0     Review of Systems  Neurological:       Tingling   All other systems reviewed and are negative.      Objective:   Physical Exam  Constitutional: He is oriented to person, place, and time. He appears well-developed and well-nourished.  HENT:  Head: Normocephalic and atraumatic.  Eyes: EOM are normal. Pupils are equal, round, and reactive to light.  Neck: Normal range of motion. Neck supple.  Cardiovascular: Normal rate and regular rhythm.  Pulmonary/Chest: Effort normal. No respiratory distress. He has no wheezes.  Abdominal: Bowel sounds are normal. He exhibits no distension. There is no tenderness.  Musculoskeletal:  Right shoulder: He exhibits mild   tenderness over subacromial space and subdeltoid space as well as with ER, IR and rtc maneuvers. Left biceps non tender . Left shoulder: Normal.  Neurological: He is alert and oriented to person, place, and time. He has normal reflexes. A sensory deficit is present right arm and leg. No cranial nerve deficit. Tinel's negative at elbow.  Excellent strength and coordination on the right side. Still with diminished Fine touch. Arm and leg are not allodynic or hypersensitive. Gait is stable. He is still clumsy with  gait on the right side.  Psychiatric: He has a normal mood and affect. His behavior is normal. Judgment and thought content normal.   Assessment & Plan:   1. 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 RIGHT shoulder pain/RTC- doing well--more short head biceps on exam today.  -encouraged ice, rest, ROM, and sensible activities -After informed consent and preparation of the skin with betadine and isopropyl alcohol, I injected 6mg  (1cc) of celestone and 4cc of 1% lidocaine around the left short biceps tendon via anterior approach. Additionally, aspiration was  performed prior to injection. The patient tolerated well, and no complications were encountered. Afterward the area was cleaned and dressed. Post- injection instructions were provided. No more shots for the next 4 months at least.  3. HTN-. Improved. Follow up per routine  4. F/U with me in 4 months. Fifteen minutes of face to face patient care time were spent during this visit. All questions were encouraged and answered.

## 2015-06-29 NOTE — Patient Instructions (Signed)
WALK WITH YOUR WIFE!!!!!!   PLEASE CALL ME WITH ANY PROBLEMS OR QUESTIONS [phone]).  HAVE A GOOD DAY!

## 2015-10-19 ENCOUNTER — Encounter: Payer: Medicare Other | Attending: Physical Medicine & Rehabilitation | Admitting: Physical Medicine & Rehabilitation

## 2015-10-19 ENCOUNTER — Encounter: Payer: Self-pay | Admitting: Physical Medicine & Rehabilitation

## 2015-10-19 VITALS — BP 145/98 | HR 72

## 2015-10-19 DIAGNOSIS — G894 Chronic pain syndrome: Secondary | ICD-10-CM | POA: Diagnosis not present

## 2015-10-19 DIAGNOSIS — I10 Essential (primary) hypertension: Secondary | ICD-10-CM | POA: Insufficient documentation

## 2015-10-19 DIAGNOSIS — I6330 Cerebral infarction due to thrombosis of unspecified cerebral artery: Secondary | ICD-10-CM

## 2015-10-19 DIAGNOSIS — M7581 Other shoulder lesions, right shoulder: Secondary | ICD-10-CM

## 2015-10-19 DIAGNOSIS — E785 Hyperlipidemia, unspecified: Secondary | ICD-10-CM | POA: Insufficient documentation

## 2015-10-19 DIAGNOSIS — M25511 Pain in right shoulder: Secondary | ICD-10-CM | POA: Diagnosis not present

## 2015-10-19 DIAGNOSIS — M792 Neuralgia and neuritis, unspecified: Secondary | ICD-10-CM | POA: Diagnosis not present

## 2015-10-19 DIAGNOSIS — M7521 Bicipital tendinitis, right shoulder: Secondary | ICD-10-CM | POA: Diagnosis not present

## 2015-10-19 DIAGNOSIS — I69898 Other sequelae of other cerebrovascular disease: Secondary | ICD-10-CM | POA: Diagnosis not present

## 2015-10-19 MED ORDER — TRAMADOL HCL 50 MG PO TABS: 50.0000 mg | ORAL_TABLET | Freq: Three times a day (TID) | ORAL | Status: DC | PRN

## 2015-10-19 MED ORDER — GABAPENTIN 300 MG PO CAPS: ORAL_CAPSULE | ORAL | Status: DC

## 2015-10-19 NOTE — Progress Notes (Signed)
Subjective:    Patient ID: Kyle [name], male    DOB: 11-30-1945, 69 y.o.   MRN: [id number]  HPI   Mr. Leddon is here in follow up of his left PCA infarct and associated. He has noticed more tingling in the right leg with the change in weather. Sometimes it's tender.   His right shoulder is doing fairly well. I had injected it earlier in the year. He uses an OTC cream which seems to help as well.   Pain Inventory Average Pain 6 Pain Right Now 6 My pain is burning  In the last 24 hours, has pain interfered with the following? General activity 6 Relation with others 6 Enjoyment of life 6 What TIME of day is your pain at its worst? night Sleep (in general) NA  Pain is worse with: walking Pain improves with: injections Relief from Meds: 5  Mobility walk with assistance use a cane ability to climb steps?  yes do you drive?  no  Function disabled: date disabled NA Do you have any goals in this area?  no  Neuro/Psych No problems in this area  Prior Studies Any changes since last visit?  no  Physicians involved in your care Any changes since last visit?  no   History reviewed. No pertinent family history. Social History   Social History  . Marital Status: Married    Spouse Name: N/A  . Number of Children: N/A  . Years of Education: N/A   Social History Main Topics  . Smoking status: Never Smoker   . Smokeless tobacco: None  . Alcohol Use: No  . Drug Use: No  . Sexual Activity: Not Asked   Other Topics Concern  . None   Social History Narrative   History reviewed. No pertinent past surgical history. Past Medical History  Diagnosis Date  . Hyperlipidemia   . Stroke ([place])    BP 145/98 mmHg  Pulse 72  SpO2 95%  Opioid Risk Score:   Fall Risk Score:  `1  Depression screen PHQ 2/9  Depression screen PHQ 2/9 04/26/2015  Decreased Interest 0  Down, Depressed, Hopeless 0  PHQ - 2 Score 0  Altered sleeping 0  Tired, decreased energy 0  Change in  appetite 0  Feeling bad or failure about yourself  0  Trouble concentrating 0  Moving slowly or fidgety/restless 0  Suicidal thoughts 0  PHQ-9 Score 0      Review of Systems  All other systems reviewed and are negative.      Objective:   Physical Exam  Constitutional: He is oriented to person, place, and time. He appears well-developed and well-nourished.  HENT:  Head: Normocephalic and atraumatic.  Eyes: EOM are normal. Pupils are equal, round, and reactive to light.  Neck: Normal range of motion. Neck supple.  Cardiovascular: Normal rate and regular rhythm.  Pulmonary/Chest: Effort normal. No respiratory distress. He has no wheezes.  Abdominal: Bowel sounds are normal. He exhibits no distension. There is no tenderness.  Musculoskeletal:  Right shoulder: He demonstrates improved right shoulder ROM with IR/ER. Impingement maneuver negative.  . Left shoulder: Normal.  Neurological: He is alert and oriented to person, place, and time. He has normal reflexes. A sensory deficit is present right arm and leg. No cranial nerve deficit. Tinel's negative at elbow.  Excellent strength and coordination on the right side. Still with diminished Fine touch in the arm and leg.  Arm and leg are not allodynic or hypersensitive. Gait is stable.  He is still clumsy with gait on the right side but utilizes a cane effectively. Psychiatric: He has a normal mood and affect. His behavior is normal. Judgment and thought content normal.    Assessment & Plan:   1. 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 Refilled neurontin today -refilled tramadol today also 2. RIGHT shoulder pain/RTC- doing well--occasional biceps tendon involvement  -Continued HEP,  ice, rest, ROM, and sensible activities  -no injection today.   3. HTN-. Improved. Follow up per routine  4. F/U with me in 6 months. Fifteen minutes of face to face patient  care time were spent during this visit. All questions were encouraged and answered.

## 2015-10-19 NOTE — Patient Instructions (Signed)
PLEASE CALL ME WITH ANY PROBLEMS OR QUESTIONS (#[phone]). HAVE A HAPPY HOLIDAY SEASON!!!    

## 2015-11-12 ENCOUNTER — Other Ambulatory Visit: Payer: Self-pay | Admitting: Physical Medicine & Rehabilitation

## 2015-11-29 DIAGNOSIS — I6930 Unspecified sequelae of cerebral infarction: Secondary | ICD-10-CM | POA: Diagnosis not present

## 2015-11-29 DIAGNOSIS — Z125 Encounter for screening for malignant neoplasm of prostate: Secondary | ICD-10-CM | POA: Diagnosis not present

## 2015-11-29 DIAGNOSIS — Z23 Encounter for immunization: Secondary | ICD-10-CM | POA: Diagnosis not present

## 2015-11-29 DIAGNOSIS — Z6830 Body mass index (BMI) 30.0-30.9, adult: Secondary | ICD-10-CM | POA: Diagnosis not present

## 2015-11-29 DIAGNOSIS — G8191 Hemiplegia, unspecified affecting right dominant side: Secondary | ICD-10-CM | POA: Diagnosis not present

## 2015-11-29 DIAGNOSIS — E6609 Other obesity due to excess calories: Secondary | ICD-10-CM | POA: Diagnosis not present

## 2015-11-29 DIAGNOSIS — E7800 Pure hypercholesterolemia, unspecified: Secondary | ICD-10-CM | POA: Diagnosis not present

## 2015-11-29 DIAGNOSIS — J449 Chronic obstructive pulmonary disease, unspecified: Secondary | ICD-10-CM | POA: Diagnosis not present

## 2015-11-29 DIAGNOSIS — Z0000 Encounter for general adult medical examination without abnormal findings: Secondary | ICD-10-CM | POA: Diagnosis not present

## 2016-04-06 ENCOUNTER — Other Ambulatory Visit: Payer: Self-pay | Admitting: *Deleted

## 2016-04-06 DIAGNOSIS — M792 Neuralgia and neuritis, unspecified: Secondary | ICD-10-CM

## 2016-04-06 DIAGNOSIS — M7581 Other shoulder lesions, right shoulder: Secondary | ICD-10-CM

## 2016-04-06 DIAGNOSIS — M7521 Bicipital tendinitis, right shoulder: Secondary | ICD-10-CM

## 2016-04-06 DIAGNOSIS — I6330 Cerebral infarction due to thrombosis of unspecified cerebral artery: Secondary | ICD-10-CM

## 2016-04-06 MED ORDER — TRAMADOL HCL 50 MG PO TABS: 50.0000 mg | ORAL_TABLET | Freq: Three times a day (TID) | ORAL | Status: DC | PRN

## 2016-04-13 ENCOUNTER — Other Ambulatory Visit: Payer: Self-pay | Admitting: Physical Medicine & Rehabilitation

## 2016-04-18 ENCOUNTER — Encounter: Payer: Medicare Other | Attending: Physical Medicine & Rehabilitation | Admitting: Physical Medicine & Rehabilitation

## 2016-04-18 ENCOUNTER — Encounter: Payer: Self-pay | Admitting: Physical Medicine & Rehabilitation

## 2016-04-18 VITALS — BP 147/83 | HR 68 | Resp 14 | Wt 166.0 lb

## 2016-04-18 DIAGNOSIS — M7581 Other shoulder lesions, right shoulder: Secondary | ICD-10-CM

## 2016-04-18 DIAGNOSIS — I6320 Cerebral infarction due to unspecified occlusion or stenosis of unspecified precerebral arteries: Secondary | ICD-10-CM | POA: Diagnosis not present

## 2016-04-18 DIAGNOSIS — Z8673 Personal history of transient ischemic attack (TIA), and cerebral infarction without residual deficits: Secondary | ICD-10-CM | POA: Diagnosis not present

## 2016-04-18 DIAGNOSIS — M25511 Pain in right shoulder: Secondary | ICD-10-CM | POA: Diagnosis not present

## 2016-04-18 DIAGNOSIS — I10 Essential (primary) hypertension: Secondary | ICD-10-CM | POA: Insufficient documentation

## 2016-04-18 DIAGNOSIS — I6330 Cerebral infarction due to thrombosis of unspecified cerebral artery: Secondary | ICD-10-CM | POA: Diagnosis not present

## 2016-04-18 DIAGNOSIS — M792 Neuralgia and neuritis, unspecified: Secondary | ICD-10-CM

## 2016-04-18 DIAGNOSIS — M79671 Pain in right foot: Secondary | ICD-10-CM

## 2016-04-18 DIAGNOSIS — M7521 Bicipital tendinitis, right shoulder: Secondary | ICD-10-CM

## 2016-04-18 DIAGNOSIS — E785 Hyperlipidemia, unspecified: Secondary | ICD-10-CM | POA: Diagnosis not present

## 2016-04-18 MED ORDER — GABAPENTIN 300 MG PO CAPS: ORAL_CAPSULE | ORAL | Status: DC

## 2016-04-18 MED ORDER — DICLOFENAC SODIUM 1 % TD GEL: 1.0000 "application " | Freq: Three times a day (TID) | TRANSDERMAL | Status: DC

## 2016-04-18 MED ORDER — TRAMADOL HCL 50 MG PO TABS: 50.0000 mg | ORAL_TABLET | Freq: Three times a day (TID) | ORAL | Status: DC | PRN

## 2016-04-18 NOTE — Patient Instructions (Addendum)
PLEASE CALL ME WITH ANY PROBLEMS OR QUESTIONS [phone])  TRY WEARING BETTER SHOES WHICH HAVE ARCH SUPPORT AND BETTER CUSHIONING.  ICE TO RIGHT FOOT ALSO TWO-THREE TIMES DAILY

## 2016-04-18 NOTE — Progress Notes (Signed)
Subjective:    Patient ID: Barkley Boards, male    DOB: 14-Jan-1946, 70 y.o.   MRN: [id number]  HPI   Mr. Bayles is here in follow up of his left PCA infarct. He has ongoing pain in his right shoulder and right leg. He does state that his right foot/toes have been painful after slipping on the floor about a month ago. There doesn't appear to have been any direct trauma or focal twisting of the foot.  .Otherwise his pain level is about the same. The right shoulder has been manageable. He still occasionally uses a tramadol for severe pain. He continues on gabapentin as prescribed.     Pain Inventory Average Pain 6 Pain Right Now 6 My pain is constant, dull and aching  In the last 24 hours, has pain interfered with the following? General activity 5 Relation with others 5 Enjoyment of life 5 What TIME of day is your pain at its worst? daytime Sleep (in general) Good  Pain is worse with: walking Pain improves with: medication Relief from Meds: 8  Mobility walk with assistance use a cane Do you have any goals in this area?  no  Function retired Do you have any goals in this area?  no  Neuro/Psych No problems in this area  Prior Studies Any changes since last visit?  no  Physicians involved in your care Any changes since last visit?  no   History reviewed. No pertinent family history. Social History   Social History  . Marital Status: Married    Spouse Name: N/A  . Number of Children: N/A  . Years of Education: N/A   Social History Main Topics  . Smoking status: Never Smoker   . Smokeless tobacco: None  . Alcohol Use: No  . Drug Use: No  . Sexual Activity: Not Asked   Other Topics Concern  . None   Social History Narrative   History reviewed. No pertinent past surgical history. Past Medical History  Diagnosis Date  . Hyperlipidemia   . Stroke ([place])    BP 147/83 mmHg  Pulse 68  Resp 14  Wt 166 lb (75.297 kg)  SpO2 97%  Opioid Risk Score:   Fall Risk  Score:  `1  Depression screen PHQ 2/9  Depression screen PHQ 2/9 04/26/2015  Decreased Interest 0  Down, Depressed, Hopeless 0  PHQ - 2 Score 0  Altered sleeping 0  Tired, decreased energy 0  Change in appetite 0  Feeling bad or failure about yourself  0  Trouble concentrating 0  Moving slowly or fidgety/restless 0  Suicidal thoughts 0  PHQ-9 Score 0     Review of Systems  All other systems reviewed and are negative.      Objective:   Physical Exam  Constitutional: He is oriented to person, place, and time. He appears well-developed and well-nourished.  HENT:  Head: Normocephalic and atraumatic.  Eyes: EOM are normal. Pupils are equal, round, and reactive to light.  Neck: Normal range of motion. Neck supple.  Cardiovascular: Normal rate and regular rhythm.  Pulmonary/Chest: Effort normal. No respiratory distress. He has no wheezes.  Abdominal: Bowel sounds are normal. He exhibits no distension. There is no tenderness.  Musculoskeletal:  Right shoulder: He demonstrates improved right shoulder ROM with IR/ER. Impingement maneuver negative. . Left shoulder: Normal.  Neurological: He is alert and oriented to person, place, and time. He has normal reflexes. A sensory deficit is present right arm and leg. No cranial nerve  deficit. Tinel's negative at elbow.  Excellent strength and coordination on the right side. Still with diminished Fine touch in the arm and leg. Arm and leg are not allodynic or hypersensitive. Gait is stable. He is still clumsy with gait on the right side but utilizes a cane effectively.  Psychiatric: He has a normal mood and affect. His behavior is normal. Judgment and thought content normal.    Assessment & Plan:   1. 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 RIGHT shoulder pain/RTC- doing well--occasional biceps tendon involvement  -Continued HEP, ice, rest, ROM, and  sensible activities .  3. HTN-. Improved. Follow up per routine  4. 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 Needs something with better arch support and cushioning. 5. F/U with me in 2 months. Fifteen minutes of face to face patient care time were spent during this visit. All questions were encouraged and answered.

## 2016-05-29 DIAGNOSIS — J449 Chronic obstructive pulmonary disease, unspecified: Secondary | ICD-10-CM | POA: Diagnosis not present

## 2016-05-29 DIAGNOSIS — E7800 Pure hypercholesterolemia, unspecified: Secondary | ICD-10-CM | POA: Diagnosis not present

## 2016-06-12 ENCOUNTER — Other Ambulatory Visit: Payer: Self-pay | Admitting: *Deleted

## 2016-06-12 DIAGNOSIS — I6330 Cerebral infarction due to thrombosis of unspecified cerebral artery: Secondary | ICD-10-CM

## 2016-06-12 DIAGNOSIS — M7521 Bicipital tendinitis, right shoulder: Secondary | ICD-10-CM

## 2016-06-12 DIAGNOSIS — M792 Neuralgia and neuritis, unspecified: Secondary | ICD-10-CM

## 2016-06-12 DIAGNOSIS — M7581 Other shoulder lesions, right shoulder: Secondary | ICD-10-CM

## 2016-06-12 MED ORDER — TRAMADOL HCL 50 MG PO TABS: 50.0000 mg | ORAL_TABLET | Freq: Three times a day (TID) | ORAL | 0 refills | Status: DC | PRN

## 2016-06-19 ENCOUNTER — Encounter: Payer: Self-pay | Admitting: Physical Medicine & Rehabilitation

## 2016-06-19 ENCOUNTER — Encounter: Payer: Medicare Other | Attending: Physical Medicine & Rehabilitation | Admitting: Physical Medicine & Rehabilitation

## 2016-06-19 VITALS — BP 148/87 | HR 71

## 2016-06-19 DIAGNOSIS — Z8673 Personal history of transient ischemic attack (TIA), and cerebral infarction without residual deficits: Secondary | ICD-10-CM | POA: Insufficient documentation

## 2016-06-19 DIAGNOSIS — I6330 Cerebral infarction due to thrombosis of unspecified cerebral artery: Secondary | ICD-10-CM

## 2016-06-19 DIAGNOSIS — E785 Hyperlipidemia, unspecified: Secondary | ICD-10-CM | POA: Insufficient documentation

## 2016-06-19 DIAGNOSIS — M79671 Pain in right foot: Secondary | ICD-10-CM | POA: Diagnosis not present

## 2016-06-19 DIAGNOSIS — M792 Neuralgia and neuritis, unspecified: Secondary | ICD-10-CM | POA: Diagnosis not present

## 2016-06-19 DIAGNOSIS — M7521 Bicipital tendinitis, right shoulder: Secondary | ICD-10-CM | POA: Diagnosis not present

## 2016-06-19 DIAGNOSIS — M25511 Pain in right shoulder: Secondary | ICD-10-CM | POA: Diagnosis not present

## 2016-06-19 DIAGNOSIS — I10 Essential (primary) hypertension: Secondary | ICD-10-CM | POA: Insufficient documentation

## 2016-06-19 DIAGNOSIS — I6320 Cerebral infarction due to unspecified occlusion or stenosis of unspecified precerebral arteries: Secondary | ICD-10-CM | POA: Insufficient documentation

## 2016-06-19 DIAGNOSIS — M7581 Other shoulder lesions, right shoulder: Secondary | ICD-10-CM

## 2016-06-19 MED ORDER — TRAMADOL HCL 50 MG PO TABS: 50.0000 mg | ORAL_TABLET | Freq: Three times a day (TID) | ORAL | 2 refills | Status: DC | PRN

## 2016-06-19 NOTE — Patient Instructions (Signed)
PLEASE CALL ME WITH ANY PROBLEMS OR QUESTIONS ([phone])  

## 2016-06-19 NOTE — Progress Notes (Signed)
Subjective:    Patient ID: Kyle [name], male    DOB: 04-Aug-1946, 70 y.o.   MRN: [id number]  HPI   Mr. Ryals is here in follow up of his CVA and chronic pain. He had good response to the voltaren gel. He also got new shoes. He applies the voltaren to his right foot, knee and shoulder. Mr. Dragonetti walks with his cane. He hasn't had any falls. Tramadol is used for severe breakthrough pain, most often at night.    Pain Inventory Average Pain 5 Pain Right Now 7 My pain is sharp  In the last 24 hours, has pain interfered with the following? General activity 4 Relation with others 4 Enjoyment of life 4 What TIME of day is your pain at its worst? daytime Sleep (in general) Good  Pain is worse with: inactivity Pain improves with: medication Relief from Meds: 6  Mobility use a cane Do you have any goals in this area?  no  Function retired Do you have any goals in this area?  no  Neuro/Psych weakness  Prior Studies Any changes since last visit?  no  Physicians involved in your care Any changes since last visit?  no   History reviewed. No pertinent family history. Social History   Social History  . Marital status: Married    Spouse name: N/A  . Number of children: N/A  . Years of education: N/A   Social History Main Topics  . Smoking status: Never Smoker  . Smokeless tobacco: Never Used  . Alcohol use No  . Drug use: No  . Sexual activity: Not Asked   Other Topics Concern  . None   Social History Narrative  . None   History reviewed. No pertinent surgical history. Past Medical History:  Diagnosis Date  . Hyperlipidemia   . Stroke ([place])    BP (!) 148/87   Pulse 71   SpO2 94%   Opioid Risk Score:   Fall Risk Score:  `1  Depression screen PHQ 2/9  Depression screen PHQ 2/9 04/26/2015  Decreased Interest 0  Down, Depressed, Hopeless 0  PHQ - 2 Score 0  Altered sleeping 0  Tired, decreased energy 0  Change in appetite 0  Feeling bad or failure about  yourself  0  Trouble concentrating 0  Moving slowly or fidgety/restless 0  Suicidal thoughts 0  PHQ-9 Score 0    Review of Systems  HENT: Negative.   Eyes: Negative.   Respiratory: Negative.   Cardiovascular: Negative.   Gastrointestinal: Negative.   Endocrine: Negative.   Genitourinary: Negative.   Musculoskeletal: Negative.   Skin: Negative.   Allergic/Immunologic: Negative.   Neurological: Positive for weakness.  Hematological: Negative.   Psychiatric/Behavioral: Negative.        Objective:   Physical Exam  Constitutional: He is oriented to person, place, and time. He appears well-developed and well-nourished.  HENT:  Head: Normocephalic and atraumatic.  Eyes: EOM are normal. Pupils are equal, round, and reactive to light.  Neck: Normal range of motion. Neck supple.  Cardiovascular: Normal rate and regular rhythm.  Pulmonary/Chest: Effort normal. No respiratory distress. He has no wheezes.  Abdominal: Bowel sounds are normal. He exhibits no distension. There is no tenderness.  Musculoskeletal:  Right shoulder without impingement signs. No antalgia on right and no MT pain with palpation or weight bearing/ROM.  Neurological: He is alert and oriented to person, place, and time. He has normal reflexes. A sensory deficit is present right arm and leg.  No cranial nerve deficit. Tinel's negative at elbow.  Excellent strength and coordination on the right side. Still with diminished Fine touch in the arm and leg. Arm and leg are not allodynic or hypersensitive. Gait is stable. He is still clumsy with gait on the right side but utilizes a cane effectively.  Psychiatric: He has a normal mood and affect. His behavior is normal. Judgment and thought content normal.    Assessment & Plan:   1. 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 RF'ed today   2. RIGHT shoulder pain/RTC- doing well--occasional biceps tendon  involvement  -Continued HEP, ice, rest, ROM, and sensible activities .  3. HTN-. Improved. Follow up per routine  4. Right MT pain: likely OA                       -ice                       -voltaren gel TID                       -continue with more appropriate shoe wear which has arch support and cushioned insole. 5. F/U with me in 6 months. Fifteen minutes of face to face patient care time were spent during this visit. All questions were encouraged and answered.

## 2016-10-31 ENCOUNTER — Other Ambulatory Visit: Payer: Self-pay | Admitting: Physical Medicine & Rehabilitation

## 2016-10-31 DIAGNOSIS — M792 Neuralgia and neuritis, unspecified: Secondary | ICD-10-CM

## 2016-10-31 DIAGNOSIS — M7581 Other shoulder lesions, right shoulder: Secondary | ICD-10-CM

## 2016-10-31 DIAGNOSIS — M7521 Bicipital tendinitis, right shoulder: Secondary | ICD-10-CM

## 2016-10-31 DIAGNOSIS — M79671 Pain in right foot: Secondary | ICD-10-CM

## 2016-10-31 DIAGNOSIS — I6330 Cerebral infarction due to thrombosis of unspecified cerebral artery: Secondary | ICD-10-CM

## 2016-11-10 ENCOUNTER — Other Ambulatory Visit: Payer: Self-pay | Admitting: Physical Medicine & Rehabilitation

## 2016-11-10 DIAGNOSIS — M79671 Pain in right foot: Secondary | ICD-10-CM

## 2016-11-10 DIAGNOSIS — M7581 Other shoulder lesions, right shoulder: Secondary | ICD-10-CM

## 2016-12-04 DIAGNOSIS — Z1389 Encounter for screening for other disorder: Secondary | ICD-10-CM | POA: Diagnosis not present

## 2016-12-04 DIAGNOSIS — J449 Chronic obstructive pulmonary disease, unspecified: Secondary | ICD-10-CM | POA: Diagnosis not present

## 2016-12-04 DIAGNOSIS — G8191 Hemiplegia, unspecified affecting right dominant side: Secondary | ICD-10-CM | POA: Diagnosis not present

## 2016-12-04 DIAGNOSIS — E7800 Pure hypercholesterolemia, unspecified: Secondary | ICD-10-CM | POA: Diagnosis not present

## 2016-12-04 DIAGNOSIS — Z0000 Encounter for general adult medical examination without abnormal findings: Secondary | ICD-10-CM | POA: Diagnosis not present

## 2016-12-04 DIAGNOSIS — Z23 Encounter for immunization: Secondary | ICD-10-CM | POA: Diagnosis not present

## 2016-12-20 ENCOUNTER — Encounter: Payer: Medicare Other | Attending: Physical Medicine & Rehabilitation | Admitting: Physical Medicine & Rehabilitation

## 2016-12-20 ENCOUNTER — Encounter: Payer: Self-pay | Admitting: Physical Medicine & Rehabilitation

## 2016-12-20 VITALS — BP 145/84 | HR 74

## 2016-12-20 DIAGNOSIS — I10 Essential (primary) hypertension: Secondary | ICD-10-CM | POA: Diagnosis not present

## 2016-12-20 DIAGNOSIS — M79671 Pain in right foot: Secondary | ICD-10-CM

## 2016-12-20 DIAGNOSIS — E785 Hyperlipidemia, unspecified: Secondary | ICD-10-CM | POA: Diagnosis not present

## 2016-12-20 DIAGNOSIS — M792 Neuralgia and neuritis, unspecified: Secondary | ICD-10-CM

## 2016-12-20 DIAGNOSIS — I69351 Hemiplegia and hemiparesis following cerebral infarction affecting right dominant side: Secondary | ICD-10-CM | POA: Insufficient documentation

## 2016-12-20 DIAGNOSIS — Z5189 Encounter for other specified aftercare: Secondary | ICD-10-CM | POA: Diagnosis not present

## 2016-12-20 DIAGNOSIS — G8929 Other chronic pain: Secondary | ICD-10-CM | POA: Diagnosis not present

## 2016-12-20 DIAGNOSIS — I6330 Cerebral infarction due to thrombosis of unspecified cerebral artery: Secondary | ICD-10-CM

## 2016-12-20 DIAGNOSIS — M25511 Pain in right shoulder: Secondary | ICD-10-CM | POA: Insufficient documentation

## 2016-12-20 DIAGNOSIS — M7581 Other shoulder lesions, right shoulder: Secondary | ICD-10-CM

## 2016-12-20 DIAGNOSIS — M7521 Bicipital tendinitis, right shoulder: Secondary | ICD-10-CM | POA: Diagnosis not present

## 2016-12-20 MED ORDER — TRAMADOL HCL 50 MG PO TABS: 50.0000 mg | ORAL_TABLET | Freq: Three times a day (TID) | ORAL | 2 refills | Status: DC | PRN

## 2016-12-20 NOTE — Progress Notes (Signed)
Subjective:    Patient ID: Barkley Boards, male    DOB: Oct 12, 1946, 71 y.o.   MRN: [id number]  HPI   Mr Litterer is here in follow up of his chronic right hemiparesis and associated deficits he states that the cold weather and changes tend to exacerbate his pain but overall it's more tolerable. He is using his gabapentin daily and tramadol for breakthrough pain 1-2 x per day if that. He likes the voltaren gel and uses it on his right foot and shoulder also.   He reports no changes in his health status. He is utilizing a cane for balance. No falls reported.   Pain Inventory Average Pain 8 Pain Right Now 8 My pain is no answer  In the last 24 hours, has pain interfered with the following? General activity 8 Relation with others 0 Enjoyment of life 8 What TIME of day is your pain at its worst? no answer Sleep (in general) NA  Pain is worse with: no answer Pain improves with: no answer Relief from Meds: no answer  Mobility use a cane ability to climb steps?  yes do you drive?  no  Function disabled: date disabled .  Neuro/Psych trouble walking  Prior Studies Any changes since last visit?  no  Physicians involved in your care Any changes since last visit?  no   No family history on file. Social History   Social History  . Marital status: Married    Spouse name: N/A  . Number of children: N/A  . Years of education: N/A   Social History Main Topics  . Smoking status: Never Smoker  . Smokeless tobacco: Never Used  . Alcohol use No  . Drug use: No  . Sexual activity: Not Asked   Other Topics Concern  . None   Social History Narrative  . None   No past surgical history on file. Past Medical History:  Diagnosis Date  . Hyperlipidemia   . Stroke ([place])    BP (!) 145/84   Pulse 74   SpO2 93%   Opioid Risk Score:   Fall Risk Score:  `1  Depression screen PHQ 2/9  Depression screen [facility] 2/9 12/20/2016 04/26/2015  Decreased Interest 0 0  Down, Depressed,  Hopeless 0 0  PHQ - 2 Score 0 0  Altered sleeping - 0  Tired, decreased energy - 0  Change in appetite - 0  Feeling bad or failure about yourself  - 0  Trouble concentrating - 0  Moving slowly or fidgety/restless - 0  Suicidal thoughts - 0  PHQ-9 Score - 0    Review of Systems  Constitutional: Negative.   HENT: Negative.   Eyes: Negative.   Respiratory: Negative.   Cardiovascular: Negative.   Gastrointestinal: Negative.   Endocrine: Negative.   Genitourinary: Negative.   Musculoskeletal: Negative.   Skin: Negative.   Allergic/Immunologic: Negative.   Neurological: Negative.   Hematological: Negative.   Psychiatric/Behavioral: Negative.   All other systems reviewed and are negative.      Objective:   Physical Exam Constitutional: He is oriented to person, place, and time. He appears well-developed and well-nourished.  HENT:  Head: Normocephalic and atraumatic.  Eyes: EOM are normal. Pupils are equal, round, and reactive to light.  Neck: Normal range of motion. Neck supple.  Cardiovascular: RRR.  Pulmonary/Chest:  CTA B.  Abdominal: BS +.  Musculoskeletal:  Right shoulder without impingement signs. Can touch behind head and behind waist. . No antalgia on right and  no MT pain with palpation or weight bearing/ROM.  Neurological: He is alert and oriented to person, place, and time language barrier but communicates through interpreter. He has normal reflexes. A sensory deficitis present right arm and leg. No cranial nerve deficit.  .  5/5 strength and coordination on the right side.  Persistent decrease in Fine touch in the arm and leg. Arm and leg are not allodynic or hypersensitive. Gait remains stable, using a cane for balance. Favors right leg during weight bearing due to sensory deficits.  Psychiatric: He has a normal mood and affect. His behavior is normal. Judgment and thought content normal.    Assessment & Plan:  1. Left PCA infarct with right hemisensory  deficits and pain syndrome  -continue neurontin which remains beneficial.  -refill tramadol today, 50mg  q8 prn, #60 with 2RF We will continue the opioid monitoring program, this consists of regular clinic visits, examinations,random urine drug screen, pill counts as well as use of [place] Controlled Substance Reporting System.   2. RIGHT shoulder pain/RTC/biceps tendonitis---doing well -Continued HEP, ice, rest, ROM, and sensible activities .  3. HTN-. Improved. Follow up per routine  4. Right MT pain: likely OA--resolved, continue voltaren gel 5. F/U with me in 6 months.  Fifteen minutes of face to face patient care time were spent during this visit. All questions were encouraged and answered. Greater than 50% of time during this encounter was spent counseling patient/family in regard to shoulder exercises, shoe wear, gait strategies.

## 2016-12-20 NOTE — Patient Instructions (Signed)
PLEASE FEEL FREE TO CALL OUR OFFICE WITH ANY PROBLEMS OR QUESTIONS ([phone])      

## 2017-04-27 ENCOUNTER — Other Ambulatory Visit: Payer: Self-pay | Admitting: Physical Medicine & Rehabilitation

## 2017-04-27 ENCOUNTER — Other Ambulatory Visit: Payer: Self-pay

## 2017-04-27 DIAGNOSIS — I6330 Cerebral infarction due to thrombosis of unspecified cerebral artery: Secondary | ICD-10-CM

## 2017-04-27 DIAGNOSIS — M792 Neuralgia and neuritis, unspecified: Secondary | ICD-10-CM

## 2017-04-27 DIAGNOSIS — M79671 Pain in right foot: Secondary | ICD-10-CM

## 2017-04-27 DIAGNOSIS — M7521 Bicipital tendinitis, right shoulder: Secondary | ICD-10-CM

## 2017-04-27 DIAGNOSIS — M7581 Other shoulder lesions, right shoulder: Secondary | ICD-10-CM

## 2017-05-14 ENCOUNTER — Other Ambulatory Visit: Payer: Self-pay | Admitting: Physical Medicine & Rehabilitation

## 2017-05-14 DIAGNOSIS — M7581 Other shoulder lesions, right shoulder: Secondary | ICD-10-CM

## 2017-05-14 DIAGNOSIS — M79671 Pain in right foot: Secondary | ICD-10-CM

## 2017-06-06 DIAGNOSIS — I6930 Unspecified sequelae of cerebral infarction: Secondary | ICD-10-CM | POA: Diagnosis not present

## 2017-06-06 DIAGNOSIS — G8191 Hemiplegia, unspecified affecting right dominant side: Secondary | ICD-10-CM | POA: Diagnosis not present

## 2017-06-06 DIAGNOSIS — J449 Chronic obstructive pulmonary disease, unspecified: Secondary | ICD-10-CM | POA: Diagnosis not present

## 2017-06-06 DIAGNOSIS — E7800 Pure hypercholesterolemia, unspecified: Secondary | ICD-10-CM | POA: Diagnosis not present

## 2017-06-06 DIAGNOSIS — M25511 Pain in right shoulder: Secondary | ICD-10-CM | POA: Diagnosis not present

## 2017-06-06 DIAGNOSIS — Z1389 Encounter for screening for other disorder: Secondary | ICD-10-CM | POA: Diagnosis not present

## 2017-06-18 ENCOUNTER — Ambulatory Visit: Payer: Medicare Other | Admitting: Physical Medicine & Rehabilitation

## 2017-06-20 ENCOUNTER — Encounter: Payer: Self-pay | Admitting: Physical Medicine & Rehabilitation

## 2017-06-20 ENCOUNTER — Encounter: Payer: Medicare Other | Attending: Physical Medicine & Rehabilitation | Admitting: Physical Medicine & Rehabilitation

## 2017-06-20 VITALS — BP 143/80 | HR 67

## 2017-06-20 DIAGNOSIS — G894 Chronic pain syndrome: Secondary | ICD-10-CM | POA: Insufficient documentation

## 2017-06-20 DIAGNOSIS — I10 Essential (primary) hypertension: Secondary | ICD-10-CM | POA: Diagnosis not present

## 2017-06-20 DIAGNOSIS — E785 Hyperlipidemia, unspecified: Secondary | ICD-10-CM | POA: Insufficient documentation

## 2017-06-20 DIAGNOSIS — I6330 Cerebral infarction due to thrombosis of unspecified cerebral artery: Secondary | ICD-10-CM | POA: Diagnosis not present

## 2017-06-20 DIAGNOSIS — M7581 Other shoulder lesions, right shoulder: Secondary | ICD-10-CM | POA: Insufficient documentation

## 2017-06-20 DIAGNOSIS — Z8673 Personal history of transient ischemic attack (TIA), and cerebral infarction without residual deficits: Secondary | ICD-10-CM | POA: Diagnosis not present

## 2017-06-20 DIAGNOSIS — M7521 Bicipital tendinitis, right shoulder: Secondary | ICD-10-CM | POA: Diagnosis not present

## 2017-06-20 DIAGNOSIS — M25511 Pain in right shoulder: Secondary | ICD-10-CM | POA: Insufficient documentation

## 2017-06-20 DIAGNOSIS — M792 Neuralgia and neuritis, unspecified: Secondary | ICD-10-CM | POA: Diagnosis not present

## 2017-06-20 DIAGNOSIS — M79671 Pain in right foot: Secondary | ICD-10-CM | POA: Diagnosis not present

## 2017-06-20 MED ORDER — TRAMADOL HCL 50 MG PO TABS: 50.0000 mg | ORAL_TABLET | Freq: Three times a day (TID) | ORAL | 2 refills | Status: DC | PRN

## 2017-06-20 MED ORDER — DICLOFENAC SODIUM 1 % TD GEL: 1.0000 "application " | Freq: Three times a day (TID) | TRANSDERMAL | 4 refills | Status: DC

## 2017-06-20 NOTE — Progress Notes (Signed)
Subjective:    Patient ID: Kyle [name], male    DOB: November 08, 1945, 71 y.o.   MRN: [id number]  HPI   Kyle [name] is here in follow up of his chronic right hemiparesis and associated pain/deficits. He has had worsening pain over the last few months in the right shoulder. It bothers him more with activity such as walking/using his cane. He uses his voltaren gel 3-4 x per day which helps with the pain. He also take tramadol 1-2 tabs per day, often just one tab.   Otherwise he's doing well.    Pain Inventory Average Pain 9 Pain Right Now 9 My pain is sharp  In the last 24 hours, has pain interfered with the following? General activity 9 Relation with others 9 Enjoyment of life 9 What TIME of day is your pain at its worst? all Sleep (in general) Good  Pain is worse with: unsure Pain improves with: . Relief from Meds: .  Mobility use a cane use a walker ability to climb steps?  no do you drive?  no  Function retired  Neuro/Psych trouble walking  Prior Studies Any changes since last visit?  no  Physicians involved in your care Any changes since last visit?  no   No family history on file. Social History   Social History  . Marital status: Married    Spouse name: N/A  . Number of children: N/A  . Years of education: N/A   Social History Main Topics  . Smoking status: Never Smoker  . Smokeless tobacco: Never Used  . Alcohol use No  . Drug use: No  . Sexual activity: Not on file   Other Topics Concern  . Not on file   Social History Narrative  . No narrative on file   No past surgical history on file. Past Medical History:  Diagnosis Date  . Hyperlipidemia   . Stroke [facility])    There were no vitals taken for this visit.  Opioid Risk Score:   Fall Risk Score:  `1  Depression screen PHQ 2/9  Depression screen [facility] 2/9 12/20/2016 04/26/2015  Decreased Interest 0 0  Down, Depressed, Hopeless 0 0  PHQ - 2 Score 0 0  Altered sleeping - 0  Tired, decreased  energy - 0  Change in appetite - 0  Feeling bad or failure about yourself  - 0  Trouble concentrating - 0  Moving slowly or fidgety/restless - 0  Suicidal thoughts - 0  PHQ-9 Score - 0     Review of Systems  Constitutional: Negative.   HENT: Negative.   Eyes: Negative.   Respiratory: Negative.   Cardiovascular: Negative.   Gastrointestinal: Negative.   Endocrine: Negative.   Genitourinary: Negative.   Musculoskeletal: Negative.   Skin: Negative.   Allergic/Immunologic: Negative.   Neurological: Negative.   Hematological: Negative.   Psychiatric/Behavioral: Negative.   All other systems reviewed and are negative.      Objective:   Physical Exam  Constitutional: He is oriented to person, place, and time. He appears well-developed and well-nourished.  HENT:  Head: Normocephalic and atraumatic.  Eyes: EOM are normal. Pupils are equal, round, and reactive to light.  Neck: Normal range of motion. Neck supple.  Cardiovascular: rrr.  Pulmonary/Chest:  cta b  Abdominal: BS +.  Musculoskeletal:  Right shoulder with minimal impingement signs. Biceps tendon (long)tendr to palpation.  Neurological: He is alert and oriented to person, place, and time language barrier but communicates through interpreter. He has  normal reflexes. A sensory deficitis present right arm and leg. No cranial nerve deficit.  .  5/5 strength and coordination on the right side.  gait stable. Favors right leg still during gait.  Psychiatric: He has a normal mood and affect. His behavior is normal. Judgment and thought content normal.    Assessment & Plan:  1. 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  -refilled tramadol today, 50mg  q8 prn, #60 with 2RF We will continue the opioid monitoring program, this consists of regular clinic visits, examinations, urine drug screen, pill counts as well as use of [place] Controlled  Substance Reporting System. [place] today.    2. RIGHT shoulder pain/RTC/biceps tendonitis-- reviewed HEP, provided detailed exercises and instructions today -tramadol/voltaren.  3. HTN-. Improved. Follow up per routine  4. Right MT pain: likely OA--resolved, continue voltaren gel 5. F/U with me in 6 months.  Fifteen minutes of face to face patient care time were spent during this visit. All questions were encouraged and answered. Greater than 50% of time during this encounter was spent counseling patient/family in regard to shoulder exercises, shoe wear, gait strategies.

## 2017-06-20 NOTE — Patient Instructions (Signed)
Biceps Tendon Tendinitis (Proximal) and Tenosynovitis Rehab Ask your health care provider which exercises are safe for you. Do exercises exactly as told by your health care provider and adjust them as directed. It is normal to feel mild stretching, pulling, tightness, or discomfort as you do these exercises, but you should stop right away if you feel sudden pain or your pain gets worse.Do not begin these exercises until told by your health care provider. Stretching and range of motion exercises These exercises warm up your muscles and joints and improve the movement and flexibility of your arm and shoulder. These exercises also help to relieve pain and stiffness. Exercise A: Shoulder flexion  1. Stand facing a wall. Put your left / right hand on the wall. 2. Slide your left / right hand up the wall. Stop when you feel a stretch in your shoulder, or when you reach the angle that is recommended by your health care provider. ? Use your other hand to help raise your arm, if needed. ? As your hand gets higher, you may need to step closer to the wall. ? Avoid shrugging your shoulder while you raise your arm. To do this, keep your shoulder blade tucked down toward your spine. 3. Hold for __________ seconds. 4. Slowly return to the starting position. Use your other arm to help, if needed. Repeat __________ times. Complete this exercise __________ times a day. Exercise B: Posterior capsule stretch ( passive horizontal adduction) 1. Sit or stand and pull your left / right elbow across your chest, toward your other shoulder. Stop when you feel a gentle stretch in the back of your shoulder and upper arm. ? Keep your arm at shoulder height. ? Keep your arm as close to your body as you comfortably can. 2. Hold for __________ seconds. 3. Slowly return to the starting position. Repeat __________ times. Complete this exercise __________ times a day. Strengthening exercises These exercises build strength and  endurance in your arm and shoulder. Endurance is the ability to use your muscles for a long time, even after your muscles get tired. Exercise C: Elbow flexion, supinated  1. Sit on a stable chair without armrests, or stand. 2. If directed, hold a __________ weight in your left / right hand, or hold an exercise band with both hands. Your palms should face up toward the ceiling at the starting position. 3. Bend your left / right elbow and move your hand up toward your shoulder. Keep your other arm straight down, in the starting position. 4. Slowly return to the starting position. Repeat __________ times. Complete this exercise __________ times a day. Exercise D: Scapular protraction, supine  1. Lie on your back on a firm surface. If directed, hold a __________ weight in your left / right hand. 2. Raise your left / right arm straight into the air so your hand is directly above your shoulder joint. 3. Push the weight into the air so your shoulder lifts off of the surface that you are lying on. Do not move your head, neck, or back. 4. Hold for __________ seconds. 5. Slowly return to the starting position. Let your muscles relax completely before you repeat this exercise. Repeat __________ times. Complete this exercise __________ times a day. Exercise E: Scapular retraction  1. Sit in a stable chair without armrests, or stand. 2. Secure an exercise band to a stable object in front of you so the band is at shoulder height. 3. Hold one end of the exercise band in   each hand. 4. Squeeze your shoulder blades together and move your elbows slightly behind you. Do not shrug your shoulders. 5. Hold for __________ seconds. 6. Slowly return to the starting position. Repeat __________ times. Complete this exercise __________ times a day. This information is not intended to replace advice given to you by your health care provider. Make sure you discuss any questions you have with your health care  provider. Document Released: 10/16/2005 Document Revised: 06/22/2016 Document Reviewed: 09/24/2015 Elsevier Interactive Patient Education  2018 [employer]  

## 2017-11-06 ENCOUNTER — Other Ambulatory Visit: Payer: Self-pay | Admitting: Physical Medicine & Rehabilitation

## 2017-11-06 DIAGNOSIS — M792 Neuralgia and neuritis, unspecified: Secondary | ICD-10-CM

## 2017-11-06 DIAGNOSIS — I6330 Cerebral infarction due to thrombosis of unspecified cerebral artery: Secondary | ICD-10-CM

## 2017-11-06 DIAGNOSIS — M79671 Pain in right foot: Secondary | ICD-10-CM

## 2017-11-06 DIAGNOSIS — M7521 Bicipital tendinitis, right shoulder: Secondary | ICD-10-CM

## 2017-11-06 DIAGNOSIS — M7581 Other shoulder lesions, right shoulder: Secondary | ICD-10-CM

## 2017-12-19 ENCOUNTER — Encounter: Payer: Self-pay | Admitting: Physical Medicine & Rehabilitation

## 2017-12-19 ENCOUNTER — Encounter: Payer: Medicare Other | Attending: Physical Medicine & Rehabilitation | Admitting: Physical Medicine & Rehabilitation

## 2017-12-19 VITALS — BP 133/79 | HR 69

## 2017-12-19 DIAGNOSIS — I6330 Cerebral infarction due to thrombosis of unspecified cerebral artery: Secondary | ICD-10-CM | POA: Insufficient documentation

## 2017-12-19 DIAGNOSIS — M792 Neuralgia and neuritis, unspecified: Secondary | ICD-10-CM

## 2017-12-19 DIAGNOSIS — M25511 Pain in right shoulder: Secondary | ICD-10-CM | POA: Insufficient documentation

## 2017-12-19 DIAGNOSIS — Z23 Encounter for immunization: Secondary | ICD-10-CM | POA: Diagnosis not present

## 2017-12-19 DIAGNOSIS — Z8673 Personal history of transient ischemic attack (TIA), and cerebral infarction without residual deficits: Secondary | ICD-10-CM | POA: Insufficient documentation

## 2017-12-19 DIAGNOSIS — Z79899 Other long term (current) drug therapy: Secondary | ICD-10-CM | POA: Diagnosis not present

## 2017-12-19 DIAGNOSIS — I10 Essential (primary) hypertension: Secondary | ICD-10-CM | POA: Insufficient documentation

## 2017-12-19 DIAGNOSIS — M7581 Other shoulder lesions, right shoulder: Secondary | ICD-10-CM

## 2017-12-19 DIAGNOSIS — M7521 Bicipital tendinitis, right shoulder: Secondary | ICD-10-CM | POA: Diagnosis not present

## 2017-12-19 DIAGNOSIS — E785 Hyperlipidemia, unspecified: Secondary | ICD-10-CM | POA: Insufficient documentation

## 2017-12-19 MED ORDER — TRAMADOL HCL 50 MG PO TABS: 50.0000 mg | ORAL_TABLET | Freq: Three times a day (TID) | ORAL | 3 refills | Status: DC | PRN

## 2017-12-19 NOTE — Progress Notes (Signed)
Subjective:    Patient ID: Kyle [name], male    DOB: 1946-10-27, 72 y.o.   MRN: [id number]  HPI   Kyle [name] is here in follow up of his left PCA infarct and associated pain and functional deficits. He remains on tramadol for pain. He is taking the tramadol at night time typically along with his last gabapentin dose. He remains on gabapentin TID.   Otherwise he's been doing well. He stays active at home. He does exercises for his right arm typically daily.   Denies any bowel or bladder issues.    Pain Inventory Average Pain 3 Pain Right Now 3 My pain is dull and tingling  In the last 24 hours, has pain interfered with the following? General activity 0 Relation with others 0 Enjoyment of life 0 What TIME of day is your pain at its worst? All Sleep (in general) NA  Pain is worse with: n/a Pain improves with: n/a Relief from Meds: 0  Mobility Do you have any goals in this area?  no  Function Do you have any goals in this area?  no  Neuro/Psych No problems in this area  Prior Studies Any changes since last visit?  no  Physicians involved in your care Any changes since last visit?  no   No family history on file. Social History   Socioeconomic History  . Marital status: Married    Spouse name: None  . Number of children: None  . Years of education: None  . Highest education level: None  Social Needs  . Financial resource strain: None  . Food insecurity - worry: None  . Food insecurity - inability: None  . Transportation needs - medical: None  . Transportation needs - non-medical: None  Occupational History  . None  Tobacco Use  . Smoking status: Never Smoker  . Smokeless tobacco: Never Used  Substance and Sexual Activity  . Alcohol use: No  . Drug use: No  . Sexual activity: None  Other Topics Concern  . None  Social History Narrative  . None   No past surgical history on file. Past Medical History:  Diagnosis Date  . Hyperlipidemia   . Stroke  ([facility])    BP 133/79   Pulse 69   SpO2 95%   Opioid Risk Score:   Fall Risk Score:  `1  Depression screen PHQ 2/9  Depression screen [facility] 2/9 12/20/2016 04/26/2015  Decreased Interest 0 0  Down, Depressed, Hopeless 0 0  PHQ - 2 Score 0 0  Altered sleeping - 0  Tired, decreased energy - 0  Change in appetite - 0  Feeling bad or failure about yourself  - 0  Trouble concentrating - 0  Moving slowly or fidgety/restless - 0  Suicidal thoughts - 0  PHQ-9 Score - 0     Review of Systems  All other systems reviewed and are negative.      Objective:   Physical Exam  Constitutional: He is oriented to person, place, and time. He appears well-developed and well-nourished.  HENT:  Head: Normocephalic and atraumatic.  Eyes: EOM are normal. Pupils are equal, round, and reactive to light.  Neck: Normal range of motion. Neck supple.  Cardiovascular: RRR.  Pulmonary/Chest: CTA B Abdominal: BS +.  Musculoskeletal:  Right shoulder with minimal impingement signs again today.  Neurological: He is alert and oriented to person, place, and time language barrier but communicates through interpreter. He has normal reflexes. A sensory deficitis present right arm  and leg which is unchanged. . No cranial nerve deficit. .  5/5strength and coordination on the right side. gait stable and more balanced. Psychiatric: He has a normal mood and affect. His behavior is normal. Judgment and thought content normal.    Assessment & Plan:  1. 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  -refilled tramadol today, 50mg  q8 prn, #60 with 3RF We will continue the opioid monitoring program, this consists of regular clinic visits, examinations, routine drug screening, pill counts as well as use of [place] Controlled Substance Reporting System. [place] was [place] today.    2. RIGHT shoulder pain/RTC/biceps tendonitis-- continued HEP   -tramadol/voltaren.  3. HTN-. Improved. Follow up per routine  4. Right MT pain: likely OA--resolved, continue voltaren gel 5. F/U with me in 6 months. Ten minutes of face to face patient care time were spent during this visit. All questions were encouraged and answered

## 2017-12-19 NOTE — Patient Instructions (Signed)
PLEASE FEEL FREE TO CALL OUR OFFICE WITH ANY PROBLEMS OR QUESTIONS ([phone])      

## 2018-04-29 ENCOUNTER — Other Ambulatory Visit: Payer: Self-pay | Admitting: Physical Medicine & Rehabilitation

## 2018-04-29 DIAGNOSIS — M7581 Other shoulder lesions, right shoulder: Secondary | ICD-10-CM

## 2018-04-29 DIAGNOSIS — M792 Neuralgia and neuritis, unspecified: Secondary | ICD-10-CM

## 2018-04-29 DIAGNOSIS — M79671 Pain in right foot: Secondary | ICD-10-CM

## 2018-04-29 DIAGNOSIS — M7521 Bicipital tendinitis, right shoulder: Secondary | ICD-10-CM

## 2018-04-29 DIAGNOSIS — I6330 Cerebral infarction due to thrombosis of unspecified cerebral artery: Secondary | ICD-10-CM

## 2018-05-26 ENCOUNTER — Other Ambulatory Visit: Payer: Self-pay | Admitting: Physical Medicine & Rehabilitation

## 2018-05-26 DIAGNOSIS — M7521 Bicipital tendinitis, right shoulder: Secondary | ICD-10-CM

## 2018-05-26 DIAGNOSIS — M79671 Pain in right foot: Secondary | ICD-10-CM

## 2018-05-26 DIAGNOSIS — M7581 Other shoulder lesions, right shoulder: Secondary | ICD-10-CM

## 2018-05-26 DIAGNOSIS — M792 Neuralgia and neuritis, unspecified: Secondary | ICD-10-CM

## 2018-05-26 DIAGNOSIS — I6330 Cerebral infarction due to thrombosis of unspecified cerebral artery: Secondary | ICD-10-CM

## 2018-06-10 DIAGNOSIS — R7301 Impaired fasting glucose: Secondary | ICD-10-CM | POA: Diagnosis not present

## 2018-06-10 DIAGNOSIS — Z125 Encounter for screening for malignant neoplasm of prostate: Secondary | ICD-10-CM | POA: Diagnosis not present

## 2018-06-10 DIAGNOSIS — J449 Chronic obstructive pulmonary disease, unspecified: Secondary | ICD-10-CM | POA: Diagnosis not present

## 2018-06-10 DIAGNOSIS — G8191 Hemiplegia, unspecified affecting right dominant side: Secondary | ICD-10-CM | POA: Diagnosis not present

## 2018-06-10 DIAGNOSIS — Z1389 Encounter for screening for other disorder: Secondary | ICD-10-CM | POA: Diagnosis not present

## 2018-06-10 DIAGNOSIS — Z0000 Encounter for general adult medical examination without abnormal findings: Secondary | ICD-10-CM | POA: Diagnosis not present

## 2018-06-10 DIAGNOSIS — E7800 Pure hypercholesterolemia, unspecified: Secondary | ICD-10-CM | POA: Diagnosis not present

## 2018-06-10 DIAGNOSIS — Z23 Encounter for immunization: Secondary | ICD-10-CM | POA: Diagnosis not present

## 2018-06-10 DIAGNOSIS — I6930 Unspecified sequelae of cerebral infarction: Secondary | ICD-10-CM | POA: Diagnosis not present

## 2018-06-11 ENCOUNTER — Other Ambulatory Visit: Payer: Self-pay | Admitting: Family Medicine

## 2018-06-11 DIAGNOSIS — Z87891 Personal history of nicotine dependence: Secondary | ICD-10-CM

## 2018-06-18 ENCOUNTER — Encounter: Payer: Medicare Other | Attending: Physical Medicine & Rehabilitation | Admitting: Physical Medicine & Rehabilitation

## 2018-06-18 ENCOUNTER — Other Ambulatory Visit: Payer: Self-pay

## 2018-06-18 ENCOUNTER — Encounter: Payer: Self-pay | Admitting: Physical Medicine & Rehabilitation

## 2018-06-18 VITALS — BP 119/76 | HR 83 | Ht 63.5 in | Wt 162.0 lb

## 2018-06-18 DIAGNOSIS — M7581 Other shoulder lesions, right shoulder: Secondary | ICD-10-CM | POA: Diagnosis not present

## 2018-06-18 DIAGNOSIS — Z5181 Encounter for therapeutic drug level monitoring: Secondary | ICD-10-CM | POA: Diagnosis not present

## 2018-06-18 DIAGNOSIS — I10 Essential (primary) hypertension: Secondary | ICD-10-CM | POA: Insufficient documentation

## 2018-06-18 DIAGNOSIS — E785 Hyperlipidemia, unspecified: Secondary | ICD-10-CM | POA: Diagnosis not present

## 2018-06-18 DIAGNOSIS — M792 Neuralgia and neuritis, unspecified: Secondary | ICD-10-CM

## 2018-06-18 DIAGNOSIS — Z79891 Long term (current) use of opiate analgesic: Secondary | ICD-10-CM | POA: Diagnosis not present

## 2018-06-18 DIAGNOSIS — G894 Chronic pain syndrome: Secondary | ICD-10-CM | POA: Diagnosis not present

## 2018-06-18 DIAGNOSIS — I69351 Hemiplegia and hemiparesis following cerebral infarction affecting right dominant side: Secondary | ICD-10-CM | POA: Insufficient documentation

## 2018-06-18 DIAGNOSIS — I6330 Cerebral infarction due to thrombosis of unspecified cerebral artery: Secondary | ICD-10-CM

## 2018-06-18 DIAGNOSIS — R200 Anesthesia of skin: Secondary | ICD-10-CM | POA: Insufficient documentation

## 2018-06-18 DIAGNOSIS — M25511 Pain in right shoulder: Secondary | ICD-10-CM | POA: Insufficient documentation

## 2018-06-18 DIAGNOSIS — M7521 Bicipital tendinitis, right shoulder: Secondary | ICD-10-CM | POA: Diagnosis not present

## 2018-06-18 MED ORDER — TRAMADOL HCL 50 MG PO TABS: 50.0000 mg | ORAL_TABLET | Freq: Three times a day (TID) | ORAL | 2 refills | Status: DC | PRN

## 2018-06-18 NOTE — Progress Notes (Signed)
Subjective:    Patient ID: Kyle [name], male    DOB: 06-03-1946, 72 y.o.   MRN: [id number]  HPI  Patient is here in follow-up of his stroke and associated right hemiparesis and shoulder pain.  He has been doing fairly well over the last several months.  He is using his right arm more functionally.  He still taking tramadol for more severe pain.  He denies any problems with bowel or bladder function.  Balance has been good.  He tries to walk regularly.  Wife remains involved with his care as well.    Pain Inventory Average Pain 6 Pain Right Now 6 My pain is numbness  In the last 24 hours, has pain interfered with the following? General activity 6 Relation with others 6 Enjoyment of life 6 What TIME of day is your pain at its worst? morning Sleep (in general) Good  Pain is worse with: sitting Pain improves with: medication Relief from Meds: 8  Mobility walk with assistance use a cane  Function retired  Neuro/Psych numbness trouble walking  Prior Studies Any changes since last visit?  no  Physicians involved in your care Any changes since last visit?  no   No family history on file. Social History   Socioeconomic History  . Marital status: Married    Spouse name: Not on file  . Number of children: Not on file  . Years of education: Not on file  . Highest education level: Not on file  Occupational History  . Not on file  Social Needs  . Financial resource strain: Not on file  . Food insecurity:    Worry: Not on file    Inability: Not on file  . Transportation needs:    Medical: Not on file    Non-medical: Not on file  Tobacco Use  . Smoking status: Never Smoker  . Smokeless tobacco: Never Used  Substance and Sexual Activity  . Alcohol use: No  . Drug use: No  . Sexual activity: Not on file  Lifestyle  . Physical activity:    Days per week: Not on file    Minutes per session: Not on file  . Stress: Not on file  Relationships  . Social  connections:    Talks on phone: Not on file    Gets together: Not on file    Attends religious service: Not on file    Active member of club or organization: Not on file    Attends meetings of clubs or organizations: Not on file    Relationship status: Not on file  Other Topics Concern  . Not on file  Social History Narrative  . Not on file   No past surgical history on file. Past Medical History:  Diagnosis Date  . Hyperlipidemia   . Stroke ([place])    BP 119/76   Pulse 83   Ht 5' 3.5" (1.613 m)   Wt 162 lb (73.5 kg)   SpO2 94%   BMI 28.25 kg/m   Opioid Risk Score:   Fall Risk Score:  `1  Depression screen PHQ 2/9  Depression screen [facility] 2/9 06/18/2018 12/20/2016 04/26/2015  Decreased Interest 0 0 0  Down, Depressed, Hopeless 0 0 0  PHQ - 2 Score 0 0 0  Altered sleeping - - 0  Tired, decreased energy - - 0  Change in appetite - - 0  Feeling bad or failure about yourself  - - 0  Trouble concentrating - - 0  Moving  slowly or fidgety/restless - - 0  Suicidal thoughts - - 0  PHQ-9 Score - - 0    Review of Systems  Constitutional: Negative.   HENT: Negative.   Eyes: Negative.   Respiratory: Negative.   Cardiovascular: Negative.   Gastrointestinal: Negative.   Endocrine: Negative.   Genitourinary: Negative.   Musculoskeletal: Negative.   Skin: Negative.   Allergic/Immunologic: Negative.   Neurological: Negative.   Hematological: Negative.   Psychiatric/Behavioral: Negative.   All other systems reviewed and are negative.      Objective:   Physical Exam General: No acute distress HEENT: EOMI, oral membranes moist Cards: reg rate  Chest: normal effort Abdomen: Soft, NT, ND Skin: dry, intact Extremities: no edema     Musculoskeletal: Right shoulder with improved ROM Neurological: He is alert and oriented to person, place, and time language barrier but communicates through interpreter. He has normal reflexes. A sensory deficitis present right arm and leg  which is unchanged. . No cranial nerve deficit. .  5/5strength and coordination on the right side.gait stable balance normal Psychiatric: pleasant   Assessment & Plan:  1. 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  -refilledtramadol today, 50mg  q8 prn,#60with 2RF -We will continue the controlled substance monitoring program, this consists of regular clinic visits, examinations, routine drug screening, pill counts as well as use of [place] Controlled Substance Reporting System. [place] was reviewed today.   -UDS today  2. RIGHT shoulder pain/RTC/biceps tendonitis--much improved -tramadol/voltaren remain effective.  3. HTN-. Good control at present.  Follow up per routine  4. Right MT pain: likely OA--resolved, continue voltaren gel 5. F/U with me in 66months. 15 minutes of face to face patient care time were spent during this visit. All questions were encouraged and answered

## 2018-06-18 NOTE — Patient Instructions (Signed)
PLEASE FEEL FREE TO CALL OUR OFFICE WITH ANY PROBLEMS OR QUESTIONS ([phone])      

## 2018-06-23 LAB — TOXASSURE SELECT,+ANTIDEPR,UR

## 2018-06-24 DIAGNOSIS — E1169 Type 2 diabetes mellitus with other specified complication: Secondary | ICD-10-CM | POA: Diagnosis not present

## 2018-06-24 DIAGNOSIS — E1165 Type 2 diabetes mellitus with hyperglycemia: Secondary | ICD-10-CM | POA: Diagnosis not present

## 2018-06-24 DIAGNOSIS — Z23 Encounter for immunization: Secondary | ICD-10-CM | POA: Diagnosis not present

## 2018-06-25 ENCOUNTER — Telehealth: Payer: Self-pay | Admitting: *Deleted

## 2018-06-25 NOTE — Telephone Encounter (Signed)
U[place] drug screen for this encounter is consistent for prescribed medication 

## 2018-09-23 DIAGNOSIS — Z23 Encounter for immunization: Secondary | ICD-10-CM | POA: Diagnosis not present

## 2018-09-23 DIAGNOSIS — E1165 Type 2 diabetes mellitus with hyperglycemia: Secondary | ICD-10-CM | POA: Diagnosis not present

## 2018-10-07 ENCOUNTER — Other Ambulatory Visit: Payer: Self-pay | Admitting: Physical Medicine & Rehabilitation

## 2018-10-07 DIAGNOSIS — M7521 Bicipital tendinitis, right shoulder: Secondary | ICD-10-CM

## 2018-10-07 DIAGNOSIS — M792 Neuralgia and neuritis, unspecified: Secondary | ICD-10-CM

## 2018-10-07 DIAGNOSIS — I6330 Cerebral infarction due to thrombosis of unspecified cerebral artery: Secondary | ICD-10-CM

## 2018-10-07 DIAGNOSIS — M7581 Other shoulder lesions, right shoulder: Secondary | ICD-10-CM

## 2018-10-16 ENCOUNTER — Other Ambulatory Visit: Payer: Self-pay | Admitting: Physical Medicine & Rehabilitation

## 2018-10-16 DIAGNOSIS — M7521 Bicipital tendinitis, right shoulder: Secondary | ICD-10-CM

## 2018-10-16 DIAGNOSIS — M7581 Other shoulder lesions, right shoulder: Secondary | ICD-10-CM

## 2018-10-16 DIAGNOSIS — M79671 Pain in right foot: Secondary | ICD-10-CM

## 2018-10-16 DIAGNOSIS — I6330 Cerebral infarction due to thrombosis of unspecified cerebral artery: Secondary | ICD-10-CM

## 2018-10-16 DIAGNOSIS — M792 Neuralgia and neuritis, unspecified: Secondary | ICD-10-CM

## 2018-12-06 ENCOUNTER — Other Ambulatory Visit: Payer: Self-pay | Admitting: Physical Medicine & Rehabilitation

## 2018-12-06 DIAGNOSIS — M7581 Other shoulder lesions, right shoulder: Secondary | ICD-10-CM

## 2018-12-06 DIAGNOSIS — M7521 Bicipital tendinitis, right shoulder: Secondary | ICD-10-CM

## 2018-12-06 DIAGNOSIS — I6330 Cerebral infarction due to thrombosis of unspecified cerebral artery: Secondary | ICD-10-CM

## 2018-12-06 DIAGNOSIS — M792 Neuralgia and neuritis, unspecified: Secondary | ICD-10-CM

## 2018-12-06 MED ORDER — TRAMADOL HCL 50 MG PO TABS: 50.0000 mg | ORAL_TABLET | Freq: Three times a day (TID) | ORAL | 2 refills | Status: DC | PRN

## 2018-12-18 ENCOUNTER — Encounter: Payer: Medicare Other | Attending: Physical Medicine & Rehabilitation | Admitting: Physical Medicine & Rehabilitation

## 2018-12-18 ENCOUNTER — Encounter: Payer: Self-pay | Admitting: Physical Medicine & Rehabilitation

## 2018-12-18 VITALS — BP 128/78 | HR 80 | Ht 65.0 in | Wt 151.0 lb

## 2018-12-18 DIAGNOSIS — I69351 Hemiplegia and hemiparesis following cerebral infarction affecting right dominant side: Secondary | ICD-10-CM | POA: Diagnosis not present

## 2018-12-18 DIAGNOSIS — R262 Difficulty in walking, not elsewhere classified: Secondary | ICD-10-CM | POA: Insufficient documentation

## 2018-12-18 DIAGNOSIS — E785 Hyperlipidemia, unspecified: Secondary | ICD-10-CM | POA: Diagnosis not present

## 2018-12-18 DIAGNOSIS — I6330 Cerebral infarction due to thrombosis of unspecified cerebral artery: Secondary | ICD-10-CM

## 2018-12-18 DIAGNOSIS — M7521 Bicipital tendinitis, right shoulder: Secondary | ICD-10-CM | POA: Diagnosis not present

## 2018-12-18 DIAGNOSIS — I10 Essential (primary) hypertension: Secondary | ICD-10-CM | POA: Insufficient documentation

## 2018-12-18 DIAGNOSIS — M792 Neuralgia and neuritis, unspecified: Secondary | ICD-10-CM

## 2018-12-18 NOTE — Patient Instructions (Signed)
PLEASE FOLLOW EXERCISES ON HANDOUT  ICE TO SHOULDER OF IT HURTS.   DON'T OVER-DO THINGS AT HOME (DON'T LIFT TOO MUCH!)

## 2018-12-18 NOTE — Progress Notes (Signed)
Subjective:    Patient ID: Kyle [name], male    DOB: [birth year], 73 y.o.   MRN: [id number]  HPI  Patient is here in follow-up of his stroke and ongoing spastic right hemiparesis and shoulder pain.  I last saw him in August. He continues to have right shoulder pain with movement but not at rest. He is not doing regular exercises or stretches but he is active around the house.  He uses tramadol as well as Voltaren gel for pain control.  They have not worked as well lately.   Otherwise he reports no new problems.  He is active at home.  Uses a cane for balance.  Have been no falls or mishaps.  Blood pressure has been under better control.   Pain Inventory Average Pain 5 Pain Right Now 5 My pain is dull and aching  In the last 24 hours, has pain interfered with the following? General activity 5 Relation with others 5 Enjoyment of life 5 What TIME of day is your pain at its worst? movement Sleep (in general) Good  Pain is worse with: some activites Pain improves with: rest Relief from Meds: na  Mobility walk without assistance ability to climb steps?  yes do you drive?  no  Function retired  Neuro/Psych trouble walking spasms  Prior Studies Any changes since last visit?  no  Physicians involved in your care Any changes since last visit?  no   No family history on file. Social History   Socioeconomic History  . Marital status: Married    Spouse name: Not on file  . Number of children: Not on file  . Years of education: Not on file  . Highest education level: Not on file  Occupational History  . Not on file  Social Needs  . Financial resource strain: Not on file  . Food insecurity:    Worry: Not on file    Inability: Not on file  . Transportation needs:    Medical: Not on file    Non-medical: Not on file  Tobacco Use  . Smoking status: Never Smoker  . Smokeless tobacco: Never Used  Substance and Sexual Activity  . Alcohol use: No  . Drug use: No  .  Sexual activity: Not on file  Lifestyle  . Physical activity:    Days per week: Not on file    Minutes per session: Not on file  . Stress: Not on file  Relationships  . Social connections:    Talks on phone: Not on file    Gets together: Not on file    Attends religious service: Not on file    Active member of club or organization: Not on file    Attends meetings of clubs or organizations: Not on file    Relationship status: Not on file  Other Topics Concern  . Not on file  Social History Narrative  . Not on file   No past surgical history on file. Past Medical History:  Diagnosis Date  . Hyperlipidemia   . Stroke ([facility])    BP 128/78   Pulse 80   Ht 5\' 5"  (1.651 m)   Wt 151 lb (68.5 kg)   SpO2 94%   BMI 25.13 kg/m   Opioid Risk Score:   Fall Risk Score:  `1  Depression screen PHQ 2/9  Depression screen [facility] 2/9 06/18/2018 12/20/2016 04/26/2015  Decreased Interest 0 0 0  Down, Depressed, Hopeless 0 0 0  PHQ - 2 Score 0  0 0  Altered sleeping - - 0  Tired, decreased energy - - 0  Change in appetite - - 0  Feeling bad or failure about yourself  - - 0  Trouble concentrating - - 0  Moving slowly or fidgety/restless - - 0  Suicidal thoughts - - 0  PHQ-9 Score - - 0     Review of Systems  Constitutional: Negative.   HENT: Negative.   Eyes: Negative.   Respiratory: Negative.   Cardiovascular: Negative.   Gastrointestinal: Negative.   Endocrine: Negative.   Genitourinary: Negative.   Musculoskeletal: Positive for arthralgias, gait problem and myalgias.  Skin: Negative.   Allergic/Immunologic: Negative.   Hematological: Negative.   Psychiatric/Behavioral: Negative.   All other systems reviewed and are negative.      Objective:   Physical Exam  General: No acute distress HEENT: EOMI, oral membranes moist Cards: reg rate  Chest: normal effort Abdomen: Soft, NT, ND Skin: dry, intact Extremities: no edema  Musculoskeletal:Right shoulder tender along long  and short head biceps tendons.  Short head appears to be most affected today.  During our maneuver causes some discomfort.  Speeds test is equivocal.  Rotator cuff maneuvers are negative.  He had some mild tenderness in the parascapular muscles posteriorly.  Neurological: He is alert and oriented to person, place, and time language barrier but communicates through interpreter. He has normal reflexes. A sensory deficitremains Present right arm and leg which is unchanged.. No cranial nerve deficit. .  5/5strength and coordination on the right side.gait stable balance normal Psychiatric: pleasant   Assessment & Plan:  1. 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  -Continue tramadol 50mg  q8 prn,#60.  This was refilled 2 weeks ago -We will continue the controlled substance monitoring program, this consists of regular clinic visits, examinations, routine drug screening, pill counts as well as use of [place] Controlled Substance Reporting System. [place] was reviewed today.        2. RIGHT shoulder pain/RTC/biceps tendonitis--symptomatic again.  I believe he probably goes too far with activities at home and overloads the shoulder.  Discussed that fact today. -Overall symptoms are fairly mild.  Provided a basic exercise packet for the shoulder to start with. -tramadol/voltaren  to continue -Consider steroid injection at follow-up visit next month if no improvement.  3. HTN-. Good control at present.  Follow up per routine  4. Right MT pain: likely OA--resolved, continue voltaren gel 5. F/U with me in 3 months. 15 minutes of face to face patient care time were spent during this visit. All questions were encouraged and answered

## 2018-12-23 DIAGNOSIS — E1165 Type 2 diabetes mellitus with hyperglycemia: Secondary | ICD-10-CM | POA: Diagnosis not present

## 2018-12-23 DIAGNOSIS — J449 Chronic obstructive pulmonary disease, unspecified: Secondary | ICD-10-CM | POA: Diagnosis not present

## 2019-03-18 ENCOUNTER — Encounter: Payer: Medicare Other | Admitting: Physical Medicine & Rehabilitation

## 2019-04-10 ENCOUNTER — Other Ambulatory Visit: Payer: Self-pay | Admitting: Physical Medicine & Rehabilitation

## 2019-04-10 DIAGNOSIS — M7581 Other shoulder lesions, right shoulder: Secondary | ICD-10-CM

## 2019-04-10 DIAGNOSIS — M792 Neuralgia and neuritis, unspecified: Secondary | ICD-10-CM

## 2019-04-10 DIAGNOSIS — I6330 Cerebral infarction due to thrombosis of unspecified cerebral artery: Secondary | ICD-10-CM

## 2019-04-10 DIAGNOSIS — M79671 Pain in right foot: Secondary | ICD-10-CM

## 2019-04-10 DIAGNOSIS — M7521 Bicipital tendinitis, right shoulder: Secondary | ICD-10-CM

## 2019-05-02 ENCOUNTER — Other Ambulatory Visit: Payer: Self-pay | Admitting: Physical Medicine & Rehabilitation

## 2019-05-02 DIAGNOSIS — M7521 Bicipital tendinitis, right shoulder: Secondary | ICD-10-CM

## 2019-05-02 DIAGNOSIS — I6330 Cerebral infarction due to thrombosis of unspecified cerebral artery: Secondary | ICD-10-CM

## 2019-05-02 DIAGNOSIS — M7581 Other shoulder lesions, right shoulder: Secondary | ICD-10-CM

## 2019-05-02 DIAGNOSIS — M792 Neuralgia and neuritis, unspecified: Secondary | ICD-10-CM

## 2019-05-05 MED ORDER — TRAMADOL HCL 50 MG PO TABS: 50.0000 mg | ORAL_TABLET | Freq: Three times a day (TID) | ORAL | 0 refills | Status: DC | PRN

## 2019-06-16 DIAGNOSIS — E1169 Type 2 diabetes mellitus with other specified complication: Secondary | ICD-10-CM | POA: Diagnosis not present

## 2019-06-16 DIAGNOSIS — Z0000 Encounter for general adult medical examination without abnormal findings: Secondary | ICD-10-CM | POA: Diagnosis not present

## 2019-06-16 DIAGNOSIS — J449 Chronic obstructive pulmonary disease, unspecified: Secondary | ICD-10-CM | POA: Diagnosis not present

## 2019-06-16 DIAGNOSIS — Z7984 Long term (current) use of oral hypoglycemic drugs: Secondary | ICD-10-CM | POA: Diagnosis not present

## 2019-06-16 DIAGNOSIS — I6930 Unspecified sequelae of cerebral infarction: Secondary | ICD-10-CM | POA: Diagnosis not present

## 2019-06-16 DIAGNOSIS — G8191 Hemiplegia, unspecified affecting right dominant side: Secondary | ICD-10-CM | POA: Diagnosis not present

## 2019-06-16 DIAGNOSIS — E7800 Pure hypercholesterolemia, unspecified: Secondary | ICD-10-CM | POA: Diagnosis not present

## 2019-06-16 DIAGNOSIS — Z1389 Encounter for screening for other disorder: Secondary | ICD-10-CM | POA: Diagnosis not present

## 2019-06-16 DIAGNOSIS — Z86010 Personal history of colonic polyps: Secondary | ICD-10-CM | POA: Diagnosis not present

## 2019-06-30 ENCOUNTER — Other Ambulatory Visit: Payer: Self-pay | Admitting: Physical Medicine & Rehabilitation

## 2019-06-30 DIAGNOSIS — M79671 Pain in right foot: Secondary | ICD-10-CM

## 2019-06-30 DIAGNOSIS — M7581 Other shoulder lesions, right shoulder: Secondary | ICD-10-CM

## 2019-07-05 ENCOUNTER — Other Ambulatory Visit: Payer: Self-pay | Admitting: Physical Medicine & Rehabilitation

## 2019-07-05 DIAGNOSIS — M7581 Other shoulder lesions, right shoulder: Secondary | ICD-10-CM

## 2019-07-05 DIAGNOSIS — I6330 Cerebral infarction due to thrombosis of unspecified cerebral artery: Secondary | ICD-10-CM

## 2019-07-05 DIAGNOSIS — M7521 Bicipital tendinitis, right shoulder: Secondary | ICD-10-CM

## 2019-07-05 DIAGNOSIS — M792 Neuralgia and neuritis, unspecified: Secondary | ICD-10-CM

## 2019-07-08 ENCOUNTER — Other Ambulatory Visit: Payer: Self-pay | Admitting: Physical Medicine & Rehabilitation

## 2019-07-08 DIAGNOSIS — M792 Neuralgia and neuritis, unspecified: Secondary | ICD-10-CM

## 2019-07-08 DIAGNOSIS — M79671 Pain in right foot: Secondary | ICD-10-CM

## 2019-07-08 DIAGNOSIS — M7521 Bicipital tendinitis, right shoulder: Secondary | ICD-10-CM

## 2019-07-08 DIAGNOSIS — M7581 Other shoulder lesions, right shoulder: Secondary | ICD-10-CM

## 2019-07-08 DIAGNOSIS — I6330 Cerebral infarction due to thrombosis of unspecified cerebral artery: Secondary | ICD-10-CM

## 2019-07-08 NOTE — Telephone Encounter (Signed)
Called into [facility]. Called in one month supply, patient needs an appt has not been seen in a year.

## 2019-10-17 ENCOUNTER — Other Ambulatory Visit: Payer: Self-pay | Admitting: Physical Medicine & Rehabilitation

## 2019-10-17 DIAGNOSIS — M7581 Other shoulder lesions, right shoulder: Secondary | ICD-10-CM

## 2019-10-17 DIAGNOSIS — I6330 Cerebral infarction due to thrombosis of unspecified cerebral artery: Secondary | ICD-10-CM

## 2019-10-17 DIAGNOSIS — M79671 Pain in right foot: Secondary | ICD-10-CM

## 2019-10-17 DIAGNOSIS — M792 Neuralgia and neuritis, unspecified: Secondary | ICD-10-CM

## 2019-10-17 DIAGNOSIS — M7521 Bicipital tendinitis, right shoulder: Secondary | ICD-10-CM

## 2019-10-27 ENCOUNTER — Other Ambulatory Visit: Payer: Self-pay | Admitting: Physical Medicine & Rehabilitation

## 2019-10-27 DIAGNOSIS — M79671 Pain in right foot: Secondary | ICD-10-CM

## 2019-10-27 DIAGNOSIS — M7521 Bicipital tendinitis, right shoulder: Secondary | ICD-10-CM

## 2019-10-27 DIAGNOSIS — I6330 Cerebral infarction due to thrombosis of unspecified cerebral artery: Secondary | ICD-10-CM

## 2019-10-27 DIAGNOSIS — M792 Neuralgia and neuritis, unspecified: Secondary | ICD-10-CM

## 2019-10-27 DIAGNOSIS — M7581 Other shoulder lesions, right shoulder: Secondary | ICD-10-CM

## 2019-11-02 ENCOUNTER — Other Ambulatory Visit: Payer: Self-pay | Admitting: Physical Medicine & Rehabilitation

## 2019-11-02 DIAGNOSIS — M79671 Pain in right foot: Secondary | ICD-10-CM

## 2019-11-02 DIAGNOSIS — M792 Neuralgia and neuritis, unspecified: Secondary | ICD-10-CM

## 2019-11-02 DIAGNOSIS — M7521 Bicipital tendinitis, right shoulder: Secondary | ICD-10-CM

## 2019-11-02 DIAGNOSIS — I6330 Cerebral infarction due to thrombosis of unspecified cerebral artery: Secondary | ICD-10-CM

## 2019-11-02 DIAGNOSIS — M7581 Other shoulder lesions, right shoulder: Secondary | ICD-10-CM

## 2019-11-25 ENCOUNTER — Other Ambulatory Visit: Payer: Self-pay | Admitting: Physical Medicine & Rehabilitation

## 2019-11-25 DIAGNOSIS — M792 Neuralgia and neuritis, unspecified: Secondary | ICD-10-CM

## 2019-11-25 DIAGNOSIS — I6330 Cerebral infarction due to thrombosis of unspecified cerebral artery: Secondary | ICD-10-CM

## 2019-11-25 DIAGNOSIS — M79671 Pain in right foot: Secondary | ICD-10-CM

## 2019-11-25 DIAGNOSIS — M7521 Bicipital tendinitis, right shoulder: Secondary | ICD-10-CM

## 2019-11-25 DIAGNOSIS — M7581 Other shoulder lesions, right shoulder: Secondary | ICD-10-CM

## 2019-11-26 ENCOUNTER — Encounter: Payer: Medicare Other | Attending: Physical Medicine & Rehabilitation | Admitting: Physical Medicine & Rehabilitation

## 2019-11-26 ENCOUNTER — Other Ambulatory Visit: Payer: Self-pay

## 2019-11-26 ENCOUNTER — Encounter: Payer: Self-pay | Admitting: Physical Medicine & Rehabilitation

## 2019-11-26 DIAGNOSIS — E1169 Type 2 diabetes mellitus with other specified complication: Secondary | ICD-10-CM | POA: Diagnosis not present

## 2019-11-26 DIAGNOSIS — M7581 Other shoulder lesions, right shoulder: Secondary | ICD-10-CM

## 2019-11-26 DIAGNOSIS — M79671 Pain in right foot: Secondary | ICD-10-CM | POA: Diagnosis not present

## 2019-11-26 DIAGNOSIS — M792 Neuralgia and neuritis, unspecified: Secondary | ICD-10-CM | POA: Diagnosis not present

## 2019-11-26 DIAGNOSIS — M7521 Bicipital tendinitis, right shoulder: Secondary | ICD-10-CM

## 2019-11-26 DIAGNOSIS — G8191 Hemiplegia, unspecified affecting right dominant side: Secondary | ICD-10-CM | POA: Diagnosis not present

## 2019-11-26 DIAGNOSIS — J449 Chronic obstructive pulmonary disease, unspecified: Secondary | ICD-10-CM | POA: Diagnosis not present

## 2019-11-26 DIAGNOSIS — I6330 Cerebral infarction due to thrombosis of unspecified cerebral artery: Secondary | ICD-10-CM

## 2019-11-26 DIAGNOSIS — E7800 Pure hypercholesterolemia, unspecified: Secondary | ICD-10-CM | POA: Diagnosis not present

## 2019-11-26 DIAGNOSIS — I6930 Unspecified sequelae of cerebral infarction: Secondary | ICD-10-CM | POA: Diagnosis not present

## 2019-11-26 DIAGNOSIS — Z7984 Long term (current) use of oral hypoglycemic drugs: Secondary | ICD-10-CM | POA: Diagnosis not present

## 2019-11-26 MED ORDER — TRAMADOL HCL 50 MG PO TABS: 50.0000 mg | ORAL_TABLET | Freq: Three times a day (TID) | ORAL | 0 refills | Status: DC | PRN

## 2019-11-26 MED ORDER — GABAPENTIN 300 MG PO CAPS: ORAL_CAPSULE | ORAL | 5 refills | Status: DC

## 2019-11-26 MED ORDER — GABAPENTIN 300 MG PO CAPS: ORAL_CAPSULE | ORAL | 0 refills | Status: DC

## 2019-11-26 NOTE — Progress Notes (Signed)
Subjective:    Patient ID: Kyle [name], male    DOB: 02/18/1946, 74 y.o.   MRN: [id number]  HPI   Due to national recommendations of social distancing because of COVID 59, an audio/video tele-health visit is felt to be the most appropriate encounter for this patient at this time. See MyChart message from today for the patient's consent to a tele-health encounter with [place]. This is a follow up telephone visit for the patient who is at home. MD is at office.    I am visiting with Dieguez in regards to his residual deficits after a left PCA infarc which include right hemisensory loss/pain and right RTC/biceps tendonitis.  For the most part he has been doing fairly well over the last year.  His pain has been under reasonable control.  Pain is generally in the right arm as well as the shoulder.  He uses tramadol a few times a month for severe pain.  He uses Voltaren gel somewhat regularly for pain in the shoulder as well as in his foot.  He takes gabapentin 300 mg 3 times daily.  Reports no issues with sleep.  Mood has been positive.  His bowels and bladder are moving regularly.  He reports no new medical changes.  He and his wife have been staying home quite a bit given Covid.    Pain Inventory Average Pain 1 Pain Right Now 0 My pain is aching  In the last 24 hours, has pain interfered with the following? General activity 0 Relation with others 0 Enjoyment of life 0 What TIME of day is your pain at its worst? daytime Sleep (in general) Good  Pain is worse with: some activites Pain improves with: rest Relief from Meds: na  Mobility use a walker ability to climb steps?  yes do you drive?  no  Function disabled: date disabled .  Neuro/Psych No problems in this area  Prior Studies Any changes since last visit?  no  Physicians involved in your care Any changes since last visit?  no   No family history on file. Social History    Socioeconomic History  . Marital status: Married    Spouse name: Not on file  . Number of children: Not on file  . Years of education: Not on file  . Highest education level: Not on file  Occupational History  . Not on file  Tobacco Use  . Smoking status: Never Smoker  . Smokeless tobacco: Never Used  Substance and Sexual Activity  . Alcohol use: No  . Drug use: No  . Sexual activity: Not on file  Other Topics Concern  . Not on file  Social History Narrative  . Not on file   Social Determinants of Health   Financial Resource Strain:   . Difficulty of Paying Living Expenses: Not on file  Food Insecurity:   . Worried About Charity fundraiser in the Last Year: Not on file  . Ran Out of Food in the Last Year: Not on file  Transportation Needs:   . Lack of Transportation (Medical): Not on file  . Lack of Transportation (Non-Medical): Not on file  Physical Activity:   . Days of Exercise per Week: Not on file  . Minutes of Exercise per Session: Not on file  Stress:   . Feeling of Stress : Not on file  Social Connections:   . Frequency of Communication with Friends and Family: Not on file  .  Frequency of Social Gatherings with Friends and Family: Not on file  . Attends Religious Services: Not on file  . Active Member of Clubs or Organizations: Not on file  . Attends Archivist Meetings: Not on file  . Marital Status: Not on file   No past surgical history on file. Past Medical History:  Diagnosis Date  . Hyperlipidemia   . Stroke ([place])    Ht 5\' 6"  (1.676 m)   Wt 160 lb (72.6 kg)   BMI 25.82 kg/m   Opioid Risk Score:   Fall Risk Score:  `1  Depression screen PHQ 2/9  Depression screen [facility] 2/9 06/18/2018 12/20/2016 04/26/2015  Decreased Interest 0 0 0  Down, Depressed, Hopeless 0 0 0  PHQ - 2 Score 0 0 0  Altered sleeping - - 0  Tired, decreased energy - - 0  Change in appetite - - 0  Feeling bad or failure about yourself  - - 0  Trouble  concentrating - - 0  Moving slowly or fidgety/restless - - 0  Suicidal thoughts - - 0  PHQ-9 Score - - 0     Review of Systems  Constitutional: Negative.   HENT: Negative.   Eyes: Negative.   Respiratory: Negative.   Cardiovascular: Negative.   Gastrointestinal: Negative.   Endocrine: Negative.   Genitourinary: Negative.   Musculoskeletal: Negative.   Skin: Negative.   Allergic/Immunologic: Negative.   Neurological: Negative.   Hematological: Negative.   Psychiatric/Behavioral: Negative.   All other systems reviewed and are negative.      Objective:   Physical Exam n/a       Assessment & Plan:  1. 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  This was refilled today with 5 additional refills ordered -Continue tramadol 50mg  q8 prn,#60.   This was refilled today We will continue the controlled substance monitoring program, this consists of regular clinic visits, examinations, routine drug screening, pill counts as well as use of [place] Controlled Substance Reporting System. [place] was reviewed today.    2. RIGHT shoulder pain/RTC/biceps tendonitis -Doing fairly well at present maintain home exercise program -tramadol/voltaren to continue--refilled both of these today -Needs to make sure he does not "overdo things". 3. HTN-.Good control at present.Follow up per routine  4. Right MT pain: likely OA--- continue voltaren gel 5. F/U with me in3 months. 15 minutes of face to face patient care time were spent during this visit. All questions were encouraged and answered   7 minutes of tele-visit time was spent with this patient today. Follow up in 6 months

## 2019-12-01 DIAGNOSIS — E1169 Type 2 diabetes mellitus with other specified complication: Secondary | ICD-10-CM | POA: Diagnosis not present

## 2019-12-01 DIAGNOSIS — E7800 Pure hypercholesterolemia, unspecified: Secondary | ICD-10-CM | POA: Diagnosis not present

## 2019-12-01 DIAGNOSIS — Z7984 Long term (current) use of oral hypoglycemic drugs: Secondary | ICD-10-CM | POA: Diagnosis not present

## 2020-03-08 ENCOUNTER — Other Ambulatory Visit: Payer: Self-pay | Admitting: Physical Medicine & Rehabilitation

## 2020-03-08 DIAGNOSIS — I6330 Cerebral infarction due to thrombosis of unspecified cerebral artery: Secondary | ICD-10-CM

## 2020-03-08 DIAGNOSIS — M792 Neuralgia and neuritis, unspecified: Secondary | ICD-10-CM

## 2020-03-08 DIAGNOSIS — M7521 Bicipital tendinitis, right shoulder: Secondary | ICD-10-CM

## 2020-03-08 DIAGNOSIS — M7581 Other shoulder lesions, right shoulder: Secondary | ICD-10-CM

## 2020-05-19 ENCOUNTER — Encounter: Payer: Medicare HMO | Attending: Physical Medicine & Rehabilitation | Admitting: Physical Medicine & Rehabilitation

## 2020-06-07 DIAGNOSIS — Z0000 Encounter for general adult medical examination without abnormal findings: Secondary | ICD-10-CM | POA: Diagnosis not present

## 2020-06-07 DIAGNOSIS — I6930 Unspecified sequelae of cerebral infarction: Secondary | ICD-10-CM | POA: Diagnosis not present

## 2020-06-07 DIAGNOSIS — E7800 Pure hypercholesterolemia, unspecified: Secondary | ICD-10-CM | POA: Diagnosis not present

## 2020-06-07 DIAGNOSIS — Z7984 Long term (current) use of oral hypoglycemic drugs: Secondary | ICD-10-CM | POA: Diagnosis not present

## 2020-06-07 DIAGNOSIS — G8191 Hemiplegia, unspecified affecting right dominant side: Secondary | ICD-10-CM | POA: Diagnosis not present

## 2020-06-07 DIAGNOSIS — Z1159 Encounter for screening for other viral diseases: Secondary | ICD-10-CM | POA: Diagnosis not present

## 2020-06-07 DIAGNOSIS — Z1389 Encounter for screening for other disorder: Secondary | ICD-10-CM | POA: Diagnosis not present

## 2020-06-07 DIAGNOSIS — J449 Chronic obstructive pulmonary disease, unspecified: Secondary | ICD-10-CM | POA: Diagnosis not present

## 2020-06-07 DIAGNOSIS — E1169 Type 2 diabetes mellitus with other specified complication: Secondary | ICD-10-CM | POA: Diagnosis not present

## 2020-08-01 ENCOUNTER — Other Ambulatory Visit: Payer: Self-pay | Admitting: Physical Medicine & Rehabilitation

## 2020-08-01 DIAGNOSIS — M79671 Pain in right foot: Secondary | ICD-10-CM

## 2020-08-01 DIAGNOSIS — M792 Neuralgia and neuritis, unspecified: Secondary | ICD-10-CM

## 2020-08-01 DIAGNOSIS — M7581 Other shoulder lesions, right shoulder: Secondary | ICD-10-CM

## 2020-08-01 DIAGNOSIS — I6330 Cerebral infarction due to thrombosis of unspecified cerebral artery: Secondary | ICD-10-CM

## 2020-08-01 DIAGNOSIS — M7521 Bicipital tendinitis, right shoulder: Secondary | ICD-10-CM

## 2020-08-23 DIAGNOSIS — Z23 Encounter for immunization: Secondary | ICD-10-CM | POA: Diagnosis not present

## 2020-09-24 ENCOUNTER — Other Ambulatory Visit: Payer: Self-pay | Admitting: Physical Medicine & Rehabilitation

## 2020-09-24 DIAGNOSIS — M7581 Other shoulder lesions, right shoulder: Secondary | ICD-10-CM

## 2020-09-24 DIAGNOSIS — M7521 Bicipital tendinitis, right shoulder: Secondary | ICD-10-CM

## 2020-09-24 DIAGNOSIS — M792 Neuralgia and neuritis, unspecified: Secondary | ICD-10-CM

## 2020-09-24 DIAGNOSIS — I6330 Cerebral infarction due to thrombosis of unspecified cerebral artery: Secondary | ICD-10-CM

## 2020-09-25 ENCOUNTER — Other Ambulatory Visit: Payer: Self-pay | Admitting: Physical Medicine & Rehabilitation

## 2020-09-25 DIAGNOSIS — M792 Neuralgia and neuritis, unspecified: Secondary | ICD-10-CM

## 2020-09-25 DIAGNOSIS — M7521 Bicipital tendinitis, right shoulder: Secondary | ICD-10-CM

## 2020-09-25 DIAGNOSIS — M79671 Pain in right foot: Secondary | ICD-10-CM

## 2020-09-25 DIAGNOSIS — I6330 Cerebral infarction due to thrombosis of unspecified cerebral artery: Secondary | ICD-10-CM

## 2020-09-25 DIAGNOSIS — M7581 Other shoulder lesions, right shoulder: Secondary | ICD-10-CM

## 2020-09-30 ENCOUNTER — Other Ambulatory Visit: Payer: Self-pay | Admitting: Physical Medicine & Rehabilitation

## 2020-09-30 DIAGNOSIS — M792 Neuralgia and neuritis, unspecified: Secondary | ICD-10-CM

## 2020-09-30 DIAGNOSIS — I6330 Cerebral infarction due to thrombosis of unspecified cerebral artery: Secondary | ICD-10-CM

## 2020-09-30 DIAGNOSIS — M7581 Other shoulder lesions, right shoulder: Secondary | ICD-10-CM

## 2020-09-30 DIAGNOSIS — M7521 Bicipital tendinitis, right shoulder: Secondary | ICD-10-CM

## 2020-11-27 ENCOUNTER — Other Ambulatory Visit: Payer: Self-pay | Admitting: Physical Medicine & Rehabilitation

## 2020-11-27 DIAGNOSIS — M7581 Other shoulder lesions, right shoulder: Secondary | ICD-10-CM

## 2020-11-27 DIAGNOSIS — M792 Neuralgia and neuritis, unspecified: Secondary | ICD-10-CM

## 2020-11-27 DIAGNOSIS — M7521 Bicipital tendinitis, right shoulder: Secondary | ICD-10-CM

## 2020-11-27 DIAGNOSIS — M79671 Pain in right foot: Secondary | ICD-10-CM

## 2020-11-27 DIAGNOSIS — I6330 Cerebral infarction due to thrombosis of unspecified cerebral artery: Secondary | ICD-10-CM

## 2020-11-29 NOTE — Telephone Encounter (Signed)
Called son to let him know that patient needs to make an appt before running out of medication. No further refills.

## 2020-12-06 DIAGNOSIS — I6930 Unspecified sequelae of cerebral infarction: Secondary | ICD-10-CM | POA: Diagnosis not present

## 2020-12-06 DIAGNOSIS — I10 Essential (primary) hypertension: Secondary | ICD-10-CM | POA: Diagnosis not present

## 2020-12-06 DIAGNOSIS — E1169 Type 2 diabetes mellitus with other specified complication: Secondary | ICD-10-CM | POA: Diagnosis not present

## 2020-12-06 DIAGNOSIS — G8191 Hemiplegia, unspecified affecting right dominant side: Secondary | ICD-10-CM | POA: Diagnosis not present

## 2020-12-06 DIAGNOSIS — E7800 Pure hypercholesterolemia, unspecified: Secondary | ICD-10-CM | POA: Diagnosis not present

## 2020-12-06 DIAGNOSIS — J449 Chronic obstructive pulmonary disease, unspecified: Secondary | ICD-10-CM | POA: Diagnosis not present

## 2020-12-06 DIAGNOSIS — Z7984 Long term (current) use of oral hypoglycemic drugs: Secondary | ICD-10-CM | POA: Diagnosis not present

## 2020-12-22 ENCOUNTER — Other Ambulatory Visit: Payer: Self-pay | Admitting: Physical Medicine & Rehabilitation

## 2020-12-22 DIAGNOSIS — M7521 Bicipital tendinitis, right shoulder: Secondary | ICD-10-CM

## 2020-12-22 DIAGNOSIS — M79671 Pain in right foot: Secondary | ICD-10-CM

## 2020-12-22 DIAGNOSIS — M792 Neuralgia and neuritis, unspecified: Secondary | ICD-10-CM

## 2020-12-22 DIAGNOSIS — I6330 Cerebral infarction due to thrombosis of unspecified cerebral artery: Secondary | ICD-10-CM

## 2020-12-22 DIAGNOSIS — M7581 Other shoulder lesions, right shoulder: Secondary | ICD-10-CM

## 2021-02-02 ENCOUNTER — Encounter: Payer: Self-pay | Admitting: Physical Medicine & Rehabilitation

## 2021-02-02 ENCOUNTER — Encounter: Payer: Medicare HMO | Attending: Physical Medicine & Rehabilitation | Admitting: Physical Medicine & Rehabilitation

## 2021-02-02 ENCOUNTER — Other Ambulatory Visit: Payer: Self-pay

## 2021-02-02 VITALS — BP 132/81 | HR 83 | Temp 98.9°F

## 2021-02-02 DIAGNOSIS — M7521 Bicipital tendinitis, right shoulder: Secondary | ICD-10-CM

## 2021-02-02 DIAGNOSIS — M79671 Pain in right foot: Secondary | ICD-10-CM

## 2021-02-02 DIAGNOSIS — M792 Neuralgia and neuritis, unspecified: Secondary | ICD-10-CM | POA: Diagnosis not present

## 2021-02-02 DIAGNOSIS — M7581 Other shoulder lesions, right shoulder: Secondary | ICD-10-CM | POA: Diagnosis not present

## 2021-02-02 DIAGNOSIS — I6330 Cerebral infarction due to thrombosis of unspecified cerebral artery: Secondary | ICD-10-CM | POA: Diagnosis not present

## 2021-02-02 MED ORDER — DICLOFENAC SODIUM 1 % EX GEL: 1.0000 "application " | Freq: Three times a day (TID) | CUTANEOUS | 4 refills | Status: DC

## 2021-02-02 MED ORDER — TRAMADOL HCL 50 MG PO TABS: ORAL_TABLET | ORAL | 2 refills | Status: AC

## 2021-02-02 NOTE — Progress Notes (Signed)
Subjective:    Patient ID: Kyle [name], male    DOB: 04-01-1946, 75 y.o.   MRN: [id number]  HPI   Mr Acy is here in follow up of his left PCA infarct. He has not been as active at home but still uses his walker at home. He is in a w/c today which they use sometimes for longer dx. He is able to dress and toilet by himself. His wife will help with his socks.   His shoulder pain has been tolerable. He remains on gabapentin for nerve pain in his right arm and leg. That pain is tolerable also. He uses tramadol fo rmore severe pain. He occasionally uses voltaren gel for his right foot/leg.     Pain Inventory Average Pain 6 Pain Right Now 7 My pain is burning  In the last 24 hours, has pain interfered with the following? General activity 6 Relation with others 6 Enjoyment of life 2 What TIME of day is your pain at its worst? daytime Sleep (in general) Fair  Pain is worse with: walking Pain improves with: na Relief from Meds: na  No family history on file. Social History   Socioeconomic History  . Marital status: Married    Spouse name: Not on file  . Number of children: Not on file  . Years of education: Not on file  . Highest education level: Not on file  Occupational History  . Not on file  Tobacco Use  . Smoking status: Never Smoker  . Smokeless tobacco: Never Used  Substance and Sexual Activity  . Alcohol use: No  . Drug use: No  . Sexual activity: Not on file  Other Topics Concern  . Not on file  Social History Narrative  . Not on file   Social Determinants of Health   Financial Resource Strain: Not on file  Food Insecurity: Not on file  Transportation Needs: Not on file  Physical Activity: Not on file  Stress: Not on file  Social Connections: Not on file   No past surgical history on file. No past surgical history on file. Past Medical History:  Diagnosis Date  . Hyperlipidemia   . Stroke ([place])    BP 132/81   Pulse 83   Temp 98.9 F (37.2 C)    SpO2 96%   Opioid Risk Score:   Fall Risk Score:  `1  Depression screen PHQ 2/9  Depression screen [facility] 2/9 06/18/2018 12/20/2016 04/26/2015  Decreased Interest 0 0 0  Down, Depressed, Hopeless 0 0 0  PHQ - 2 Score 0 0 0  Altered sleeping - - 0  Tired, decreased energy - - 0  Change in appetite - - 0  Feeling bad or failure about yourself  - - 0  Trouble concentrating - - 0  Moving slowly or fidgety/restless - - 0  Suicidal thoughts - - 0  PHQ-9 Score - - 0     Review of Systems     Objective:   Physical Exam  Gen: no distress, normal appearing HEENT: oral mucosa pink and moist, NCAT Cardio: Reg rate Chest: normal effort, normal rate of breathing Abd: soft, non-distended Ext: no edema Psych: pleasant, normal affect Skin: intact Neuro: cognitively at baseline. LE 4/5, UE 5/5 Musculoskeletal: right shoulder with painless ER/IR, rotator cuff maneuver neg. Mild anterior pain in shoulder.      Assessment & Plan:  1. 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  This was refilled today with 5 additional refills ordered -Continue tramadol 50mg  q8 prn, #60.  RF today We will continue the controlled substance monitoring program, this consists of regular clinic visits, examinations, routine drug screening, pill counts as well as use of [place] Controlled Substance Reporting System. [place] was reviewed today.    -maintain activity, ambulation 2. RIGHT shoulder pain/RTC/biceps tendonitis -improved, stable 3. HTN-. Good control at present.  Follow up per routine  4. Right MT pain: likely OA--- continue voltaren gel. RF today 5. F/U with me in 6 months.  15 minutes of face to face patient care time were spent during this visit. All questions were encouraged and answered

## 2021-02-02 NOTE — Patient Instructions (Signed)
I want you getting up and walking more at home. I don't want your legs to get weaker!

## 2021-05-03 ENCOUNTER — Other Ambulatory Visit: Payer: Self-pay | Admitting: Family Medicine

## 2021-05-03 DIAGNOSIS — Z87891 Personal history of nicotine dependence: Secondary | ICD-10-CM

## 2021-05-23 ENCOUNTER — Ambulatory Visit
Admission: RE | Admit: 2021-05-23 | Discharge: 2021-05-23 | Disposition: A | Discharge status: Home / Self Care | Payer: Medicare HMO | Source: Ambulatory Visit | Attending: Family Medicine | Admitting: Family Medicine

## 2021-05-23 DIAGNOSIS — Z136 Encounter for screening for cardiovascular disorders: Secondary | ICD-10-CM | POA: Diagnosis not present

## 2021-05-23 DIAGNOSIS — Z87891 Personal history of nicotine dependence: Secondary | ICD-10-CM | POA: Diagnosis not present

## 2021-06-20 DIAGNOSIS — Z86010 Personal history of colonic polyps: Secondary | ICD-10-CM | POA: Diagnosis not present

## 2021-06-20 DIAGNOSIS — J449 Chronic obstructive pulmonary disease, unspecified: Secondary | ICD-10-CM | POA: Diagnosis not present

## 2021-06-20 DIAGNOSIS — I6930 Unspecified sequelae of cerebral infarction: Secondary | ICD-10-CM | POA: Diagnosis not present

## 2021-06-20 DIAGNOSIS — I10 Essential (primary) hypertension: Secondary | ICD-10-CM | POA: Diagnosis not present

## 2021-06-20 DIAGNOSIS — E1169 Type 2 diabetes mellitus with other specified complication: Secondary | ICD-10-CM | POA: Diagnosis not present

## 2021-06-20 DIAGNOSIS — Z0000 Encounter for general adult medical examination without abnormal findings: Secondary | ICD-10-CM | POA: Diagnosis not present

## 2021-06-20 DIAGNOSIS — Z1389 Encounter for screening for other disorder: Secondary | ICD-10-CM | POA: Diagnosis not present

## 2021-06-20 DIAGNOSIS — Z7984 Long term (current) use of oral hypoglycemic drugs: Secondary | ICD-10-CM | POA: Diagnosis not present

## 2021-06-20 DIAGNOSIS — G8191 Hemiplegia, unspecified affecting right dominant side: Secondary | ICD-10-CM | POA: Diagnosis not present

## 2021-06-20 DIAGNOSIS — E7800 Pure hypercholesterolemia, unspecified: Secondary | ICD-10-CM | POA: Diagnosis not present

## 2021-07-05 ENCOUNTER — Other Ambulatory Visit: Payer: Self-pay | Admitting: Physical Medicine & Rehabilitation

## 2021-07-05 DIAGNOSIS — M7581 Other shoulder lesions, right shoulder: Secondary | ICD-10-CM

## 2021-07-05 DIAGNOSIS — M7521 Bicipital tendinitis, right shoulder: Secondary | ICD-10-CM

## 2021-07-05 DIAGNOSIS — I6330 Cerebral infarction due to thrombosis of unspecified cerebral artery: Secondary | ICD-10-CM

## 2021-07-05 DIAGNOSIS — M79671 Pain in right foot: Secondary | ICD-10-CM

## 2021-07-05 DIAGNOSIS — M792 Neuralgia and neuritis, unspecified: Secondary | ICD-10-CM

## 2021-08-03 ENCOUNTER — Encounter: Payer: Medicare HMO | Attending: Physical Medicine & Rehabilitation | Admitting: Physical Medicine & Rehabilitation

## 2021-08-03 ENCOUNTER — Other Ambulatory Visit: Payer: Self-pay

## 2021-08-03 ENCOUNTER — Encounter: Payer: Self-pay | Admitting: Physical Medicine & Rehabilitation

## 2021-08-03 VITALS — BP 157/90 | HR 73 | Temp 98.4°F | Ht 66.0 in

## 2021-08-03 DIAGNOSIS — M7581 Other shoulder lesions, right shoulder: Secondary | ICD-10-CM | POA: Diagnosis not present

## 2021-08-03 DIAGNOSIS — I6330 Cerebral infarction due to thrombosis of unspecified cerebral artery: Secondary | ICD-10-CM | POA: Diagnosis not present

## 2021-08-03 DIAGNOSIS — F329 Major depressive disorder, single episode, unspecified: Secondary | ICD-10-CM | POA: Diagnosis not present

## 2021-08-03 MED ORDER — ESCITALOPRAM OXALATE 5 MG PO TABS: 5.0000 mg | ORAL_TABLET | Freq: Every day | ORAL | 3 refills | Status: DC

## 2021-08-03 NOTE — Progress Notes (Signed)
Subjective:    Patient ID: Kyle [name], male    DOB: [birth year], 75 y.o.   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 Son is with him today. He's become more and more sedentary. He doesn't want to get off the couch and only really walks around the house for exercise.  He denies depression but he doesn't want to leave the house or pursue any social actifities. He still has pain in his aRUE and RLE d/t his CVA.  His appetite is fair. He's continent of bowels and bladder  Pain Inventory Average Pain 6 Pain Right Now 6 My pain is intermittent and burning  LOCATION OF PAIN   pain on the right side of the body for shoulder down to right foot  BOWEL Number of stools per week: 7   BLADDER Normal  Mobility use a walker ability to climb steps?  yes do you drive?  no needs help with transfers Do you have any goals in this area?  yes  Function disabled: date disabled maybe 2007 I need assistance with the following:  meal prep, household duties, and shopping Do you have any goals in this area?  yes  Neuro/Psych numbness tremor tingling trouble walking dizziness confusion  Prior Studies Any changes since last visit?  no  Physicians involved in your care Any changes since last visit?  no   History reviewed. No pertinent family history. Social History   Socioeconomic History   Marital status: Married    Spouse name: Not on file   Number of children: Not on file   Years of education: Not on file   Highest education level: Not on file  Occupational History   Not on file  Tobacco Use   Smoking status: Never   Smokeless tobacco: Never  Vaping Use   Vaping Use: Never used  Substance and Sexual Activity   Alcohol use: No   Drug use: No   Sexual activity: Not on file  Other Topics Concern   Not on file  Social History Narrative   Not on file   Social Determinants of Health   Financial Resource Strain: Not on file  Food  Insecurity: Not on file  Transportation Needs: Not on file  Physical Activity: Not on file  Stress: Not on file  Social Connections: Not on file   History reviewed. No pertinent surgical history. Past Medical History:  Diagnosis Date   Hyperlipidemia    Stroke ([place])    Temp 98.4 F (36.9 C)   Ht 5\' 6"  (1.676 m)   BMI 25.82 kg/m   Opioid Risk Score:   Fall Risk Score:  `1  Depression screen PHQ 2/9  Depression screen [facility] 2/9 08/03/2021 06/18/2018 12/20/2016 04/26/2015  Decreased Interest 0 0 0 0  Down, Depressed, Hopeless 0 0 0 0  PHQ - 2 Score 0 0 0 0  Altered sleeping - - - 0  Tired, decreased energy - - - 0  Change in appetite - - - 0  Feeling bad or failure about yourself  - - - 0  Trouble concentrating - - - 0  Moving slowly or fidgety/restless - - - 0  Suicidal thoughts - - - 0  PHQ-9 Score - - - 0     Review of Systems  Musculoskeletal:  Positive for gait problem.       Right side of body pain from right shoulder down to foot pain  Neurological:  Positive for tremors, speech difficulty, weakness and numbness.  Psychiatric/Behavioral:  Positive for confusion.   All other systems reviewed and are negative.     Objective:   Physical Exam General: No acute distress HEENT: NCAT, EOMI, oral membranes moist Cards: reg rate  Chest: normal effort Abdomen: Soft, NT, ND Skin: dry, intact Extremities: no edema Psych: very flat.  Skin: intact Neuro: cognitively at baseline. LE 4/5, UE 5/5 Musculoskeletal: right shoulder with minimal pain withER/IR, rotator cuff maneuver neg.         Assessment & Plan:  1. 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   -Continue tramadol 50mg  q8 prn, #60.  RF today -We will continue the controlled substance monitoring program, this consists of regular clinic visits, examinations, routine drug screening, pill counts as well as use of [place] Controlled  Substance Reporting System. [place] today.    -maintain activity, ambulation 2. RIGHT shoulder pain/RTC/biceps tendonitis -improved, stable 3. Lack of motivation, ?depression -trial of lexapro 5mg  qhs -encourage activity as tolerated   4. Right MT pain: likely OA--- continue voltaren gel. RF today 5. F/U with me in 6 months.  15 minutes of face to face patient care time were spent during this visit. All questions were encouraged and answered

## 2021-08-03 NOTE — Patient Instructions (Signed)
PLEASE FEEL FREE TO CALL OUR OFFICE WITH ANY PROBLEMS OR QUESTIONS ([phone])      

## 2021-08-31 ENCOUNTER — Other Ambulatory Visit: Payer: Self-pay | Admitting: Physical Medicine & Rehabilitation

## 2021-12-19 DIAGNOSIS — J449 Chronic obstructive pulmonary disease, unspecified: Secondary | ICD-10-CM | POA: Diagnosis not present

## 2021-12-19 DIAGNOSIS — R351 Nocturia: Secondary | ICD-10-CM | POA: Diagnosis not present

## 2021-12-19 DIAGNOSIS — I10 Essential (primary) hypertension: Secondary | ICD-10-CM | POA: Diagnosis not present

## 2021-12-19 DIAGNOSIS — I6930 Unspecified sequelae of cerebral infarction: Secondary | ICD-10-CM | POA: Diagnosis not present

## 2021-12-19 DIAGNOSIS — E1169 Type 2 diabetes mellitus with other specified complication: Secondary | ICD-10-CM | POA: Diagnosis not present

## 2021-12-19 DIAGNOSIS — G8191 Hemiplegia, unspecified affecting right dominant side: Secondary | ICD-10-CM | POA: Diagnosis not present

## 2021-12-19 DIAGNOSIS — E7800 Pure hypercholesterolemia, unspecified: Secondary | ICD-10-CM | POA: Diagnosis not present

## 2022-02-01 ENCOUNTER — Encounter: Payer: Medicare HMO | Admitting: Physical Medicine & Rehabilitation

## 2022-05-01 ENCOUNTER — Encounter: Payer: Self-pay | Admitting: Physical Medicine & Rehabilitation

## 2022-05-10 ENCOUNTER — Ambulatory Visit: Payer: Medicare HMO | Admitting: Physical Medicine & Rehabilitation

## 2022-05-19 ENCOUNTER — Other Ambulatory Visit: Payer: Self-pay | Admitting: Physical Medicine & Rehabilitation

## 2022-07-10 DIAGNOSIS — J449 Chronic obstructive pulmonary disease, unspecified: Secondary | ICD-10-CM | POA: Diagnosis not present

## 2022-07-10 DIAGNOSIS — Z86010 Personal history of colonic polyps: Secondary | ICD-10-CM | POA: Diagnosis not present

## 2022-07-10 DIAGNOSIS — I10 Essential (primary) hypertension: Secondary | ICD-10-CM | POA: Diagnosis not present

## 2022-07-10 DIAGNOSIS — E1169 Type 2 diabetes mellitus with other specified complication: Secondary | ICD-10-CM | POA: Diagnosis not present

## 2022-07-10 DIAGNOSIS — I6930 Unspecified sequelae of cerebral infarction: Secondary | ICD-10-CM | POA: Diagnosis not present

## 2022-07-10 DIAGNOSIS — Z1331 Encounter for screening for depression: Secondary | ICD-10-CM | POA: Diagnosis not present

## 2022-07-10 DIAGNOSIS — E7800 Pure hypercholesterolemia, unspecified: Secondary | ICD-10-CM | POA: Diagnosis not present

## 2022-07-10 DIAGNOSIS — Z0000 Encounter for general adult medical examination without abnormal findings: Secondary | ICD-10-CM | POA: Diagnosis not present

## 2022-07-10 DIAGNOSIS — G8191 Hemiplegia, unspecified affecting right dominant side: Secondary | ICD-10-CM | POA: Diagnosis not present

## 2022-07-24 DIAGNOSIS — E781 Pure hyperglyceridemia: Secondary | ICD-10-CM | POA: Diagnosis not present

## 2022-07-24 DIAGNOSIS — Z86010 Personal history of colonic polyps: Secondary | ICD-10-CM | POA: Diagnosis not present

## 2022-07-24 DIAGNOSIS — Z6827 Body mass index (BMI) 27.0-27.9, adult: Secondary | ICD-10-CM | POA: Diagnosis not present

## 2022-07-24 DIAGNOSIS — E7800 Pure hypercholesterolemia, unspecified: Secondary | ICD-10-CM | POA: Diagnosis not present

## 2022-07-24 DIAGNOSIS — I10 Essential (primary) hypertension: Secondary | ICD-10-CM | POA: Diagnosis not present

## 2022-07-24 DIAGNOSIS — E669 Obesity, unspecified: Secondary | ICD-10-CM | POA: Diagnosis not present

## 2022-07-24 DIAGNOSIS — E1169 Type 2 diabetes mellitus with other specified complication: Secondary | ICD-10-CM | POA: Diagnosis not present

## 2022-07-24 DIAGNOSIS — J449 Chronic obstructive pulmonary disease, unspecified: Secondary | ICD-10-CM | POA: Diagnosis not present

## 2022-07-24 DIAGNOSIS — G8929 Other chronic pain: Secondary | ICD-10-CM | POA: Diagnosis not present

## 2022-07-24 DIAGNOSIS — I69351 Hemiplegia and hemiparesis following cerebral infarction affecting right dominant side: Secondary | ICD-10-CM | POA: Diagnosis not present

## 2022-08-02 ENCOUNTER — Encounter: Payer: Medicare HMO | Admitting: Physical Medicine & Rehabilitation

## 2022-08-26 ENCOUNTER — Emergency Department (HOSPITAL_COMMUNITY): Payer: Medicare HMO

## 2022-08-26 ENCOUNTER — Encounter (HOSPITAL_COMMUNITY): Payer: Self-pay | Admitting: Emergency Medicine

## 2022-08-26 ENCOUNTER — Emergency Department (HOSPITAL_COMMUNITY)
Admission: EM | Admit: 2022-08-26 | Discharge: 2022-08-26 | Discharge status: Against Medical Advice | Payer: Medicare HMO | Attending: Emergency Medicine | Admitting: Emergency Medicine

## 2022-08-26 DIAGNOSIS — M25561 Pain in right knee: Secondary | ICD-10-CM | POA: Insufficient documentation

## 2022-08-26 DIAGNOSIS — Z5321 Procedure and treatment not carried out due to patient leaving prior to being seen by health care provider: Secondary | ICD-10-CM | POA: Diagnosis not present

## 2022-08-26 DIAGNOSIS — M1712 Unilateral primary osteoarthritis, left knee: Secondary | ICD-10-CM | POA: Diagnosis not present

## 2022-08-26 DIAGNOSIS — M25562 Pain in left knee: Secondary | ICD-10-CM | POA: Diagnosis not present

## 2022-08-26 DIAGNOSIS — G8929 Other chronic pain: Secondary | ICD-10-CM | POA: Diagnosis not present

## 2022-08-26 DIAGNOSIS — M25462 Effusion, left knee: Secondary | ICD-10-CM | POA: Diagnosis not present

## 2022-08-26 DIAGNOSIS — R600 Localized edema: Secondary | ICD-10-CM | POA: Diagnosis not present

## 2022-08-26 LAB — CBC WITH DIFFERENTIAL/PLATELET
Abs Immature Granulocytes: 0.06 10*3/uL (ref 0.00–0.07)
Basophils Absolute: 0.1 10*3/uL (ref 0.0–0.1)
Basophils Relative: 1 %
Eosinophils Absolute: 0.3 10*3/uL (ref 0.0–0.5)
Eosinophils Relative: 3 %
HCT: 45.1 % (ref 39.0–52.0)
Hemoglobin: 15.1 g/dL (ref 13.0–17.0)
Immature Granulocytes: 1 %
Lymphocytes Relative: 21 %
Lymphs Abs: 2.1 10*3/uL (ref 0.7–4.0)
MCH: 27.3 pg (ref 26.0–34.0)
MCHC: 33.5 g/dL (ref 30.0–36.0)
MCV: 81.4 fL (ref 80.0–100.0)
Monocytes Absolute: 1.1 10*3/uL — ABNORMAL HIGH (ref 0.1–1.0)
Monocytes Relative: 11 %
Neutro Abs: 6.4 10*3/uL (ref 1.7–7.7)
Neutrophils Relative %: 63 %
Platelets: 362 10*3/uL (ref 150–400)
RBC: 5.54 MIL/uL (ref 4.22–5.81)
RDW: 15.8 % — ABNORMAL HIGH (ref 11.5–15.5)
WBC: 10.1 10*3/uL (ref 4.0–10.5)
nRBC: 0 % (ref 0.0–0.2)

## 2022-08-26 LAB — COMPREHENSIVE METABOLIC PANEL
ALT: 15 U/L (ref 0–44)
AST: 26 U/L (ref 15–41)
Albumin: 3.3 g/dL — ABNORMAL LOW (ref 3.5–5.0)
Alkaline Phosphatase: 68 U/L (ref 38–126)
Anion gap: 12 (ref 5–15)
BUN: 22 mg/dL (ref 8–23)
CO2: 19 mmol/L — ABNORMAL LOW (ref 22–32)
Calcium: 8.9 mg/dL (ref 8.9–10.3)
Chloride: 105 mmol/L (ref 98–111)
Creatinine, Ser: 0.8 mg/dL (ref 0.61–1.24)
GFR, Estimated: >60 mL/min (ref >60)
Glucose, Bld: 132 mg/dL — ABNORMAL HIGH (ref 70–99)
Potassium: 3.4 mmol/L — ABNORMAL LOW (ref 3.5–5.1)
Sodium: 136 mmol/L (ref 135–145)
Total Bilirubin: 1.3 mg/dL — ABNORMAL HIGH (ref 0.3–1.2)
Total Protein: 8.1 g/dL (ref 6.5–8.1)

## 2022-08-26 NOTE — ED Notes (Addendum)
Pt family member stated that he could not want any longer and is leaving with pt.

## 2022-08-26 NOTE — ED Triage Notes (Signed)
Patient with bilateral knee pain after he knelt down and got stuck in the position for about 10 minutes  and couldn't get back up. Patient denies any dizziness. Patient w/ knees red and irritated.

## 2022-08-26 NOTE — ED Provider Triage Note (Signed)
Emergency Medicine Provider Triage Evaluation Note  Kyle [name] , a 76 y.o. male  was evaluated in triage.  Pt complains of bilateral knee pain, L>R, no injury, progressively worsening. Here with son, lives with son, giving Tylenol without relief.  Has home health services, has not been to ortho.  Son seeking rehab.  Review of Systems  Positive: As above  Negative: As above  Physical Exam  BP 94/61 (BP Location: Right Arm)   Pulse 91   Temp 98.2 F (36.8 C)   Resp 18   SpO2 99%  Gen:   Awake, no distress   Resp:  Normal effort  MSK:   Moves extremities without difficulty  Other:    Medical Decision Making  Medically screening exam initiated at 11:50 AM.  Appropriate orders placed.  Kyle [name] was informed that the remainder of the evaluation will be completed by another provider, this initial triage assessment does not replace that evaluation, and the importance of remaining in the ED until their evaluation is complete.     Tacy Learn, PA-C 08/26/22 1152

## 2022-08-28 DIAGNOSIS — E7800 Pure hypercholesterolemia, unspecified: Secondary | ICD-10-CM | POA: Diagnosis not present

## 2022-08-28 DIAGNOSIS — J449 Chronic obstructive pulmonary disease, unspecified: Secondary | ICD-10-CM | POA: Diagnosis not present

## 2022-08-28 DIAGNOSIS — E1169 Type 2 diabetes mellitus with other specified complication: Secondary | ICD-10-CM | POA: Diagnosis not present

## 2022-08-28 DIAGNOSIS — I10 Essential (primary) hypertension: Secondary | ICD-10-CM | POA: Diagnosis not present

## 2022-08-30 DIAGNOSIS — M25562 Pain in left knee: Secondary | ICD-10-CM | POA: Diagnosis not present

## 2022-08-30 DIAGNOSIS — M25561 Pain in right knee: Secondary | ICD-10-CM | POA: Diagnosis not present

## 2022-09-02 DIAGNOSIS — M25562 Pain in left knee: Secondary | ICD-10-CM | POA: Diagnosis not present

## 2022-09-05 DIAGNOSIS — S82002A Unspecified fracture of left patella, initial encounter for closed fracture: Secondary | ICD-10-CM | POA: Diagnosis not present

## 2022-09-22 DIAGNOSIS — E1169 Type 2 diabetes mellitus with other specified complication: Secondary | ICD-10-CM | POA: Diagnosis not present

## 2022-09-22 DIAGNOSIS — G8929 Other chronic pain: Secondary | ICD-10-CM | POA: Diagnosis not present

## 2022-09-22 DIAGNOSIS — I10 Essential (primary) hypertension: Secondary | ICD-10-CM | POA: Diagnosis not present

## 2022-09-22 DIAGNOSIS — M2352 Chronic instability of knee, left knee: Secondary | ICD-10-CM | POA: Diagnosis not present

## 2022-09-22 DIAGNOSIS — I69351 Hemiplegia and hemiparesis following cerebral infarction affecting right dominant side: Secondary | ICD-10-CM | POA: Diagnosis not present

## 2022-09-22 DIAGNOSIS — E669 Obesity, unspecified: Secondary | ICD-10-CM | POA: Diagnosis not present

## 2022-09-22 DIAGNOSIS — J449 Chronic obstructive pulmonary disease, unspecified: Secondary | ICD-10-CM | POA: Diagnosis not present

## 2022-09-22 DIAGNOSIS — E7800 Pure hypercholesterolemia, unspecified: Secondary | ICD-10-CM | POA: Diagnosis not present

## 2022-09-22 DIAGNOSIS — Z6827 Body mass index (BMI) 27.0-27.9, adult: Secondary | ICD-10-CM | POA: Diagnosis not present

## 2022-10-02 DIAGNOSIS — M21862 Other specified acquired deformities of left lower leg: Secondary | ICD-10-CM | POA: Diagnosis not present

## 2022-10-02 DIAGNOSIS — M2352 Chronic instability of knee, left knee: Secondary | ICD-10-CM | POA: Diagnosis not present

## 2022-10-17 DIAGNOSIS — S82002D Unspecified fracture of left patella, subsequent encounter for closed fracture with routine healing: Secondary | ICD-10-CM | POA: Diagnosis not present

## 2022-11-03 ENCOUNTER — Other Ambulatory Visit: Payer: Self-pay

## 2022-11-03 ENCOUNTER — Emergency Department (HOSPITAL_COMMUNITY): Payer: Medicare HMO

## 2022-11-03 ENCOUNTER — Encounter (HOSPITAL_COMMUNITY): Payer: Self-pay

## 2022-11-03 ENCOUNTER — Emergency Department (HOSPITAL_COMMUNITY)
Admission: EM | Admit: 2022-11-03 | Discharge: 2022-11-03 | Disposition: A | Discharge status: Home / Self Care | Payer: Medicare HMO | Attending: Emergency Medicine | Admitting: Emergency Medicine

## 2022-11-03 DIAGNOSIS — R5383 Other fatigue: Secondary | ICD-10-CM | POA: Insufficient documentation

## 2022-11-03 DIAGNOSIS — Z8673 Personal history of transient ischemic attack (TIA), and cerebral infarction without residual deficits: Secondary | ICD-10-CM | POA: Diagnosis not present

## 2022-11-03 DIAGNOSIS — E876 Hypokalemia: Secondary | ICD-10-CM | POA: Diagnosis not present

## 2022-11-03 DIAGNOSIS — R531 Weakness: Secondary | ICD-10-CM | POA: Diagnosis not present

## 2022-11-03 DIAGNOSIS — R4182 Altered mental status, unspecified: Secondary | ICD-10-CM | POA: Diagnosis not present

## 2022-11-03 DIAGNOSIS — D72829 Elevated white blood cell count, unspecified: Secondary | ICD-10-CM | POA: Insufficient documentation

## 2022-11-03 DIAGNOSIS — F0390 Unspecified dementia without behavioral disturbance: Secondary | ICD-10-CM | POA: Insufficient documentation

## 2022-11-03 DIAGNOSIS — Z1152 Encounter for screening for COVID-19: Secondary | ICD-10-CM | POA: Insufficient documentation

## 2022-11-03 DIAGNOSIS — Z7982 Long term (current) use of aspirin: Secondary | ICD-10-CM | POA: Insufficient documentation

## 2022-11-03 LAB — AMMONIA: Ammonia: 37 umol/L — ABNORMAL HIGH (ref 9–35)

## 2022-11-03 LAB — COMPREHENSIVE METABOLIC PANEL
ALT: 11 U/L (ref 0–44)
AST: 21 U/L (ref 15–41)
Albumin: 3.3 g/dL — ABNORMAL LOW (ref 3.5–5.0)
Alkaline Phosphatase: 63 U/L (ref 38–126)
Anion gap: 18 — ABNORMAL HIGH (ref 5–15)
BUN: 15 mg/dL (ref 8–23)
CO2: 18 mmol/L — ABNORMAL LOW (ref 22–32)
Calcium: 8.4 mg/dL — ABNORMAL LOW (ref 8.9–10.3)
Chloride: 102 mmol/L (ref 98–111)
Creatinine, Ser: 0.94 mg/dL (ref 0.61–1.24)
GFR, Estimated: >60 mL/min (ref >60)
Glucose, Bld: 122 mg/dL — ABNORMAL HIGH (ref 70–99)
Potassium: 3.2 mmol/L — ABNORMAL LOW (ref 3.5–5.1)
Sodium: 138 mmol/L (ref 135–145)
Total Bilirubin: 1.6 mg/dL — ABNORMAL HIGH (ref 0.3–1.2)
Total Protein: 7.8 g/dL (ref 6.5–8.1)

## 2022-11-03 LAB — URINALYSIS, ROUTINE W REFLEX MICROSCOPIC
Bacteria, UA: NONE SEEN
Bilirubin Urine: NEGATIVE
Glucose, UA: >=500 mg/dL — AB
Hgb urine dipstick: NEGATIVE
Ketones, ur: 80 mg/dL — AB
Leukocytes,Ua: NEGATIVE
Nitrite: NEGATIVE
Protein, ur: 30 mg/dL — AB
Specific Gravity, Urine: 1.022 (ref 1.005–1.030)
pH: 6 (ref 5.0–8.0)

## 2022-11-03 LAB — CBC WITH DIFFERENTIAL/PLATELET
Abs Immature Granulocytes: 0.08 10*3/uL — ABNORMAL HIGH (ref 0.00–0.07)
Basophils Absolute: 0.1 10*3/uL (ref 0.0–0.1)
Basophils Relative: 1 %
Eosinophils Absolute: 0.1 10*3/uL (ref 0.0–0.5)
Eosinophils Relative: 1 %
HCT: 48.5 % (ref 39.0–52.0)
Hemoglobin: 15.9 g/dL (ref 13.0–17.0)
Immature Granulocytes: 1 %
Lymphocytes Relative: 9 %
Lymphs Abs: 1.3 10*3/uL (ref 0.7–4.0)
MCH: 27 pg (ref 26.0–34.0)
MCHC: 32.8 g/dL (ref 30.0–36.0)
MCV: 82.3 fL (ref 80.0–100.0)
Monocytes Absolute: 1.2 10*3/uL — ABNORMAL HIGH (ref 0.1–1.0)
Monocytes Relative: 8 %
Neutro Abs: 11.9 10*3/uL — ABNORMAL HIGH (ref 1.7–7.7)
Neutrophils Relative %: 80 %
Platelets: 365 10*3/uL (ref 150–400)
RBC: 5.89 MIL/uL — ABNORMAL HIGH (ref 4.22–5.81)
RDW: 18.8 % — ABNORMAL HIGH (ref 11.5–15.5)
WBC: 14.7 10*3/uL — ABNORMAL HIGH (ref 4.0–10.5)
nRBC: 0 % (ref 0.0–0.2)

## 2022-11-03 LAB — LACTIC ACID, PLASMA
Lactic Acid, Venous: 2.8 mmol/L (ref 0.5–1.9)
Lactic Acid, Venous: 1.1 mmol/L (ref 0.5–1.9)

## 2022-11-03 LAB — RESP PANEL BY RT-PCR (RSV, FLU A&B, COVID)  RVPGX2
Influenza A by PCR: NEGATIVE
Influenza B by PCR: NEGATIVE
Resp Syncytial Virus by PCR: NEGATIVE
SARS Coronavirus 2 by RT PCR: NEGATIVE

## 2022-11-03 LAB — TSH: TSH: 1.465 u[IU]/mL (ref 0.350–4.500)

## 2022-11-03 LAB — LIPASE, BLOOD: Lipase: 28 U/L (ref 11–51)

## 2022-11-03 MED ORDER — SODIUM CHLORIDE 0.9 % IV BOLUS
1000.0000 mL | Freq: Once | INTRAVENOUS | Status: AC
  Administered 2022-11-03: 1000 mL via INTRAVENOUS

## 2022-11-03 MED ORDER — POTASSIUM CHLORIDE CRYS ER 20 MEQ PO TBCR
40.0000 meq | EXTENDED_RELEASE_TABLET | Freq: Once | ORAL | Status: AC
  Administered 2022-11-03: 40 meq via ORAL
  Filled 2022-11-03: qty 2

## 2022-11-03 MED ORDER — POTASSIUM CHLORIDE CRYS ER 20 MEQ PO TBCR: 20.0000 meq | EXTENDED_RELEASE_TABLET | Freq: Every day | ORAL | 0 refills | Status: AC

## 2022-11-03 NOTE — ED Provider Notes (Signed)
[facility] DEPT Provider Note   CSN: [id number] Arrival date & time: 11/03/22  1012     History  Chief Complaint  Patient presents with   Weakness    Kyle [name] is a 77 y.o. male.  The history is provided by the patient and medical records. No language interpreter was used.  Illness Location:  Generalized fatigue and overall weakness Severity:  Moderate Onset quality:  Gradual Duration:  1 week Timing:  Constant Progression:  Waxing and waning Chronicity:  New Associated symptoms: fatigue   Associated symptoms: no abdominal pain, no chest pain, no congestion, no cough, no diarrhea, no fever, no headaches, no loss of consciousness, no nausea, no rash, no shortness of breath, no vomiting and no wheezing   Fatigue:    Severity:  Moderate   Duration:  1 week   Timing:  Constant   Progression:  Waxing and waning      Home Medications Prior to Admission medications   Medication Sig Start Date End Date Taking? Authorizing Provider  ADVAIR DISKUS 250-50 MCG/DOSE AEPB  03/14/14   [provider]  aspirin EC 81 MG tablet Take 81 mg by mouth daily. Swallow whole.    [provider]  empagliflozin (JARDIANCE) 10 MG TABS tablet Take by mouth daily.    [provider]  escitalopram (LEXAPRO) 5 MG tablet TAKE 1 TABLET BY MOUTH EVERYDAY AT BEDTIME 05/19/22   Meredith Staggers, MD  gabapentin (NEURONTIN) 300 MG capsule TAKE 1 CAPSULE BY MOUTH THREE TIMES A DAY 07/06/21   Meredith Staggers, MD  metFORMIN (GLUCOPHAGE-XR) 500 MG 24 hr tablet  04/01/21   [provider]  simvastatin (ZOCOR) 40 MG tablet Take 1 tablet by mouth at bedtime. 11/22/16   [provider]  SPIRIVA HANDIHALER 18 MCG inhalation capsule  03/14/14   [provider]  traMADol (ULTRAM) 50 MG tablet TAKE 1 TABLET (50 MG TOTAL) BY MOUTH EVERY 8 (EIGHT) HOURS AS NEEDED. (NEED APT FOR MORE REFILLS) 02/02/21   Meredith Staggers, MD      Allergies     Patient has no known allergies.    Review of Systems   Review of Systems  Constitutional:  Positive for chills and fatigue. Negative for fever.  HENT:  Negative for congestion.   Eyes:  Negative for visual disturbance.  Respiratory:  Negative for cough, chest tightness, shortness of breath and wheezing.   Cardiovascular:  Negative for chest pain.  Gastrointestinal:  Negative for abdominal pain, constipation, diarrhea, nausea and vomiting.  Genitourinary:  Positive for decreased urine volume (darker urine). Negative for flank pain.  Musculoskeletal:  Negative for back pain, neck pain and neck stiffness.  Skin:  Negative for rash and wound.  Neurological:  Negative for dizziness, loss of consciousness, weakness, light-headedness, numbness and headaches.  Psychiatric/Behavioral:  Negative for agitation and confusion.     Physical Exam Updated Vital Signs BP (!) 164/81 (BP Location: Right Arm)   Pulse 92   Temp 98.3 F (36.8 C) (Oral)   Resp (!) 22   SpO2 98%  Physical Exam Vitals and nursing note reviewed.  Constitutional:      General: He is not in acute distress.    Appearance: He is well-developed. He is not ill-appearing, toxic-appearing or diaphoretic.  HENT:     Head: Normocephalic and atraumatic.     Nose: No congestion or rhinorrhea.     Mouth/Throat:     Mouth: Mucous membranes are moist.  Pharynx: No oropharyngeal exudate or posterior oropharyngeal erythema.  Eyes:     Extraocular Movements: Extraocular movements intact.     Conjunctiva/sclera: Conjunctivae normal.     Pupils: Pupils are equal, round, and reactive to light.  Cardiovascular:     Rate and Rhythm: Normal rate and regular rhythm.     Heart sounds: No murmur heard. Pulmonary:     Effort: Pulmonary effort is normal. No respiratory distress.     Breath sounds: Normal breath sounds. No wheezing, rhonchi or rales.  Chest:     Chest wall: No tenderness.  Abdominal:     General: Abdomen is flat.      Palpations: Abdomen is soft.     Tenderness: There is no abdominal tenderness. There is no right CVA tenderness, left CVA tenderness, guarding or rebound.  Musculoskeletal:        General: No swelling.     Cervical back: Neck supple. No tenderness.     Right lower leg: No edema.     Left lower leg: No edema.  Skin:    General: Skin is warm and dry.     Capillary Refill: Capillary refill takes less than 2 seconds.     Findings: No erythema or rash.  Neurological:     General: No focal deficit present.     Mental Status: He is alert.  Psychiatric:        Mood and Affect: Mood normal.     ED Results / Procedures / Treatments   Labs (all labs ordered are listed, but only abnormal results are displayed) Labs Reviewed  URINALYSIS, ROUTINE W REFLEX MICROSCOPIC - Abnormal; Notable for the following components:      Result Value   Glucose, UA >=500 (*)    Ketones, ur 80 (*)    Protein, ur 30 (*)    All other components within normal limits  CBC WITH DIFFERENTIAL/PLATELET - Abnormal; Notable for the following components:   WBC 14.7 (*)    RBC 5.89 (*)    RDW 18.8 (*)    Neutro Abs 11.9 (*)    Monocytes Absolute 1.2 (*)    Abs Immature Granulocytes 0.08 (*)    All other components within normal limits  LACTIC ACID, PLASMA - Abnormal; Notable for the following components:   Lactic Acid, Venous 2.8 (*)    All other components within normal limits  AMMONIA - Abnormal; Notable for the following components:   Ammonia 37 (*)    All other components within normal limits  COMPREHENSIVE METABOLIC PANEL - Abnormal; Notable for the following components:   Potassium 3.2 (*)    CO2 18 (*)    Glucose, Bld 122 (*)    Calcium 8.4 (*)    Albumin 3.3 (*)    Total Bilirubin 1.6 (*)    Anion gap 18 (*)    All other components within normal limits  RESP PANEL BY RT-PCR (RSV, FLU A&B, COVID)  RVPGX2  URINE CULTURE  CULTURE, BLOOD (ROUTINE X 2)  CULTURE, BLOOD (ROUTINE X 2)  TSH  LIPASE, BLOOD   LACTIC ACID, PLASMA    EKG EKG Interpretation  Date/Time:  Friday November 03 2022 10:26:36 EST Ventricular Rate:  88 PR Interval:    QRS Duration: 106 QT Interval:  399 QTC Calculation: 483 R Axis:   -6 Text Interpretation: Normal sinus rhythm ST elevation, consider lateral injury Borderline prolonged QT interval when compared to prior, more artifact. No STEMI Confirmed by Antony Blackbird [phone]) on 11/03/2022 10:52:17  AM  Radiology DG Chest Portable 1 View  Result Date: 11/03/2022 CLINICAL DATA:  Altered mental status EXAM: PORTABLE CHEST 1 VIEW COMPARISON:  03/23/2009 FINDINGS: Thoracic spondylosis noted. The lungs appear clear. Cardiac and mediastinal contours normal. No pleural effusion identified. IMPRESSION: 1. No active cardiopulmonary disease is radiographically apparent. 2. Thoracic spondylosis. Electronically Signed   By: Van Clines M.D.   On: 11/03/2022 10:57    Procedures Procedures    Medications Ordered in ED Medications  potassium chloride SA (KLOR-CON M) CR tablet 40 mEq (has no administration in time range)  sodium chloride 0.9 % bolus 1,000 mL (1,000 mLs Intravenous New Bag/Given 11/03/22 1145)    ED Course/ Medical Decision Making/ A&P                           Medical Decision Making Amount and/or Complexity of Data Reviewed Labs: ordered. Radiology: ordered.    Ariez Neilan is a 77 y.o. male with a past medical history significant for previous stroke, dementia, and hyperlipidemia who presents via EMS for altered mental status with worsening fatigue and concern for possible UTI.  Cording to EMS report to nursing, patient has had 1 week of worsening generalized fatigue but no focal symptoms.  Patient has had some abnormal appearing and smelling urine and they were concerned for UTI.  He reportedly had not had septic vital signs and route but was given some fluids initially.  Patient speaks again and he is primarily and family should arrive shortly to help  clarify some information.  Otherwise patient was able to answer some questions initially and denied any cough but does have some chills.  On my exam, lungs were clear and chest was nontender.  Abdomen was nontender.  He was moving all extremities and had symmetric smile.  Pupils are symmetric and reactive with normal extraocular movements.  Patient is warm and his torso and cool his extremities, will get rectal temperature and other vital signs.  Given the report of possible UTI, will check urinalysis and culture however given the report for some generalized fatigue and worsening altered mental status, will get workup to look for altered mental status etiology including chest x-ray, labs, EKG, and TSH/ammonia.  Given his lack of focal deficits, we will have a family discussion before ordering imaging of his head as there was no reported falls either.  Anticipate reassessment after family arrives and after workup is further along to determine disposition.   Patient's labs began to return and did not show evidence of UTI, pneumonia, and was negative for COVID, flu, and RSV.  Lactic acid was slightly elevated, will trend. Suspect viral illness and decreased oral intake leading to dehydration and fatigue.  Care transferred to oncoming team to await reassessment after hydration and repeat lactic acid.        Final Clinical Impression(s) / ED Diagnoses Final diagnoses:  Fatigue, unspecified type    Rx / DC Orders ED Discharge Orders     None      Clinical Impression: 1. Fatigue, unspecified type     Disposition: Discharge  Condition: Good  I have discussed the results, Dx and Tx plan with the pt(& family if present). He/she/they expressed understanding and agree(s) with the plan. Discharge instructions discussed at great length. Strict return precautions discussed and pt &/or family have verbalized understanding of the instructions. No further questions at time of discharge.    New  Prescriptions   No medications  on file    Follow Up: No follow-up provider specified.    Calise Dunckel, Gwenyth Allegra, MD 11/03/22 [phone]

## 2022-11-03 NOTE — Discharge Instructions (Signed)
Your labs today show a low potassium.  You are being given potassium medicine to help treat this.  You can also try over-the-counter supplement such as boost to help with caloric intake.  If he develops fever, vomiting, confusion, or any other new/concerning symptoms then return to the ER.  Otherwise follow-up with his primary care physician.

## 2022-11-03 NOTE — ED Provider Notes (Signed)
Care to me.  Patient's lactate has returned at 1.1.  I suspect his lactic acidosis was more dehydration rather than sepsis.  He does have a leukocytosis but no clear source of an infection.  He is not confused according to son.  His other workup including urine, COVID, chest x-ray are all unremarkable.  At this point, son tells me this is actually been going on for about 3 weeks.  Will replete his potassium but he otherwise appears stable to follow-up with his PCP. Will give return precautions.   [name], MD 11/03/22 [phone]

## 2022-11-03 NOTE — ED Triage Notes (Signed)
Pt BIB EMS from home, lives with family. Pt family c/o increasing weakness for the last week. Pt has dementia at baseline. EMS reports urine odor.  84 HR 150/90 Cap 23 CBG 166 98% RA 20G L AC 300 mL NS given by EMS IV

## 2022-11-04 LAB — BLOOD CULTURE ID PANEL (REFLEXED) - BCID2

## 2022-11-04 NOTE — ED Notes (Signed)
Dr Francia Greaves aware of abnormal culture in 1st bottle.

## 2022-11-05 LAB — URINE CULTURE

## 2022-11-06 DIAGNOSIS — F0390 Unspecified dementia without behavioral disturbance: Secondary | ICD-10-CM | POA: Diagnosis not present

## 2022-11-06 DIAGNOSIS — Z8673 Personal history of transient ischemic attack (TIA), and cerebral infarction without residual deficits: Secondary | ICD-10-CM | POA: Diagnosis not present

## 2022-11-06 DIAGNOSIS — F05 Delirium due to known physiological condition: Secondary | ICD-10-CM | POA: Diagnosis not present

## 2022-11-06 DIAGNOSIS — F0394 Unspecified dementia, unspecified severity, with anxiety: Secondary | ICD-10-CM | POA: Diagnosis not present

## 2022-11-06 DIAGNOSIS — G9341 Metabolic encephalopathy: Secondary | ICD-10-CM | POA: Diagnosis not present

## 2022-11-06 DIAGNOSIS — E43 Unspecified severe protein-calorie malnutrition: Secondary | ICD-10-CM | POA: Diagnosis not present

## 2022-11-06 DIAGNOSIS — R4182 Altered mental status, unspecified: Secondary | ICD-10-CM | POA: Diagnosis not present

## 2022-11-06 DIAGNOSIS — E872 Acidosis, unspecified: Secondary | ICD-10-CM | POA: Diagnosis not present

## 2022-11-06 DIAGNOSIS — R531 Weakness: Secondary | ICD-10-CM | POA: Diagnosis not present

## 2022-11-06 DIAGNOSIS — I152 Hypertension secondary to endocrine disorders: Secondary | ICD-10-CM | POA: Diagnosis not present

## 2022-11-06 DIAGNOSIS — R9082 White matter disease, unspecified: Secondary | ICD-10-CM | POA: Diagnosis not present

## 2022-11-06 DIAGNOSIS — F0393 Unspecified dementia, unspecified severity, with mood disturbance: Secondary | ICD-10-CM | POA: Diagnosis not present

## 2022-11-06 DIAGNOSIS — R410 Disorientation, unspecified: Secondary | ICD-10-CM | POA: Diagnosis not present

## 2022-11-06 DIAGNOSIS — Z1152 Encounter for screening for COVID-19: Secondary | ICD-10-CM | POA: Diagnosis not present

## 2022-11-06 DIAGNOSIS — E860 Dehydration: Secondary | ICD-10-CM | POA: Diagnosis not present

## 2022-11-06 DIAGNOSIS — J986 Disorders of diaphragm: Secondary | ICD-10-CM | POA: Diagnosis not present

## 2022-11-06 DIAGNOSIS — R64 Cachexia: Secondary | ICD-10-CM | POA: Diagnosis not present

## 2022-11-06 DIAGNOSIS — E1159 Type 2 diabetes mellitus with other circulatory complications: Secondary | ICD-10-CM | POA: Diagnosis not present

## 2022-11-06 DIAGNOSIS — E46 Unspecified protein-calorie malnutrition: Secondary | ICD-10-CM | POA: Diagnosis not present

## 2022-11-06 LAB — CULTURE, BLOOD (ROUTINE X 2): Special Requests: ADEQUATE

## 2022-11-07 DIAGNOSIS — E43 Unspecified severe protein-calorie malnutrition: Secondary | ICD-10-CM | POA: Diagnosis not present

## 2022-11-07 DIAGNOSIS — Z7189 Other specified counseling: Secondary | ICD-10-CM | POA: Diagnosis not present

## 2022-11-07 DIAGNOSIS — F03C0 Unspecified dementia, severe, without behavioral disturbance, psychotic disturbance, mood disturbance, and anxiety: Secondary | ICD-10-CM | POA: Diagnosis not present

## 2022-11-07 DIAGNOSIS — Z515 Encounter for palliative care: Secondary | ICD-10-CM | POA: Diagnosis not present

## 2022-11-07 DIAGNOSIS — R5381 Other malaise: Secondary | ICD-10-CM | POA: Diagnosis not present

## 2022-11-07 DIAGNOSIS — F0390 Unspecified dementia without behavioral disturbance: Secondary | ICD-10-CM | POA: Diagnosis not present

## 2022-11-07 DIAGNOSIS — F32A Depression, unspecified: Secondary | ICD-10-CM | POA: Diagnosis not present

## 2022-11-08 DIAGNOSIS — F03C0 Unspecified dementia, severe, without behavioral disturbance, psychotic disturbance, mood disturbance, and anxiety: Secondary | ICD-10-CM | POA: Diagnosis not present

## 2022-11-08 LAB — CULTURE, BLOOD (ROUTINE X 2): Culture: NO GROWTH

## 2022-11-09 DIAGNOSIS — F32A Depression, unspecified: Secondary | ICD-10-CM | POA: Diagnosis not present

## 2022-11-09 DIAGNOSIS — D649 Anemia, unspecified: Secondary | ICD-10-CM | POA: Diagnosis not present

## 2022-11-09 DIAGNOSIS — E119 Type 2 diabetes mellitus without complications: Secondary | ICD-10-CM | POA: Diagnosis not present

## 2022-11-09 DIAGNOSIS — R140 Abdominal distension (gaseous): Secondary | ICD-10-CM | POA: Diagnosis not present

## 2022-11-09 DIAGNOSIS — I959 Hypotension, unspecified: Secondary | ICD-10-CM | POA: Diagnosis not present

## 2022-11-09 DIAGNOSIS — F39 Unspecified mood [affective] disorder: Secondary | ICD-10-CM | POA: Diagnosis not present

## 2022-11-09 DIAGNOSIS — F03C3 Unspecified dementia, severe, with mood disturbance: Secondary | ICD-10-CM | POA: Diagnosis not present

## 2022-11-09 DIAGNOSIS — Z515 Encounter for palliative care: Secondary | ICD-10-CM | POA: Diagnosis not present

## 2022-11-09 DIAGNOSIS — R5381 Other malaise: Secondary | ICD-10-CM | POA: Diagnosis not present

## 2022-11-09 DIAGNOSIS — R7401 Elevation of levels of liver transaminase levels: Secondary | ICD-10-CM | POA: Diagnosis not present

## 2022-11-09 DIAGNOSIS — R339 Retention of urine, unspecified: Secondary | ICD-10-CM | POA: Diagnosis not present

## 2022-11-09 DIAGNOSIS — Z9189 Other specified personal risk factors, not elsewhere classified: Secondary | ICD-10-CM | POA: Diagnosis not present

## 2022-11-09 DIAGNOSIS — E1159 Type 2 diabetes mellitus with other circulatory complications: Secondary | ICD-10-CM | POA: Diagnosis not present

## 2022-11-09 DIAGNOSIS — F03C0 Unspecified dementia, severe, without behavioral disturbance, psychotic disturbance, mood disturbance, and anxiety: Secondary | ICD-10-CM | POA: Diagnosis not present

## 2022-11-09 DIAGNOSIS — R6889 Other general symptoms and signs: Secondary | ICD-10-CM | POA: Diagnosis not present

## 2022-11-09 DIAGNOSIS — E441 Mild protein-calorie malnutrition: Secondary | ICD-10-CM | POA: Diagnosis not present

## 2022-11-09 DIAGNOSIS — Z7189 Other specified counseling: Secondary | ICD-10-CM | POA: Diagnosis not present

## 2022-11-09 DIAGNOSIS — N390 Urinary tract infection, site not specified: Secondary | ICD-10-CM | POA: Diagnosis not present

## 2022-11-09 DIAGNOSIS — E876 Hypokalemia: Secondary | ICD-10-CM | POA: Diagnosis not present

## 2022-11-09 DIAGNOSIS — R627 Adult failure to thrive: Secondary | ICD-10-CM | POA: Diagnosis not present

## 2022-11-09 DIAGNOSIS — E43 Unspecified severe protein-calorie malnutrition: Secondary | ICD-10-CM | POA: Diagnosis not present

## 2022-11-09 DIAGNOSIS — I152 Hypertension secondary to endocrine disorders: Secondary | ICD-10-CM | POA: Diagnosis not present

## 2022-11-09 DIAGNOSIS — E46 Unspecified protein-calorie malnutrition: Secondary | ICD-10-CM | POA: Diagnosis not present

## 2022-11-09 DIAGNOSIS — Z9289 Personal history of other medical treatment: Secondary | ICD-10-CM | POA: Diagnosis not present

## 2022-11-09 DIAGNOSIS — I10 Essential (primary) hypertension: Secondary | ICD-10-CM | POA: Diagnosis not present

## 2022-11-09 DIAGNOSIS — F411 Generalized anxiety disorder: Secondary | ICD-10-CM | POA: Diagnosis not present

## 2022-11-09 DIAGNOSIS — R4182 Altered mental status, unspecified: Secondary | ICD-10-CM | POA: Diagnosis not present

## 2022-11-09 DIAGNOSIS — F0390 Unspecified dementia without behavioral disturbance: Secondary | ICD-10-CM | POA: Diagnosis not present

## 2022-11-15 DIAGNOSIS — F39 Unspecified mood [affective] disorder: Secondary | ICD-10-CM | POA: Diagnosis not present

## 2022-11-15 DIAGNOSIS — R627 Adult failure to thrive: Secondary | ICD-10-CM | POA: Diagnosis not present

## 2022-11-15 DIAGNOSIS — Z9189 Other specified personal risk factors, not elsewhere classified: Secondary | ICD-10-CM | POA: Diagnosis not present

## 2022-11-15 DIAGNOSIS — Z515 Encounter for palliative care: Secondary | ICD-10-CM | POA: Diagnosis not present

## 2022-11-15 DIAGNOSIS — F03C3 Unspecified dementia, severe, with mood disturbance: Secondary | ICD-10-CM | POA: Diagnosis not present

## 2022-11-15 DIAGNOSIS — E1159 Type 2 diabetes mellitus with other circulatory complications: Secondary | ICD-10-CM | POA: Diagnosis not present

## 2022-11-15 DIAGNOSIS — I152 Hypertension secondary to endocrine disorders: Secondary | ICD-10-CM | POA: Diagnosis not present

## 2022-11-20 DIAGNOSIS — R140 Abdominal distension (gaseous): Secondary | ICD-10-CM | POA: Diagnosis not present

## 2022-11-20 DIAGNOSIS — R7401 Elevation of levels of liver transaminase levels: Secondary | ICD-10-CM | POA: Diagnosis not present

## 2022-11-20 DIAGNOSIS — E441 Mild protein-calorie malnutrition: Secondary | ICD-10-CM | POA: Diagnosis not present

## 2022-11-20 DIAGNOSIS — E876 Hypokalemia: Secondary | ICD-10-CM | POA: Diagnosis not present

## 2022-11-22 DIAGNOSIS — F0390 Unspecified dementia without behavioral disturbance: Secondary | ICD-10-CM | POA: Diagnosis not present

## 2022-11-22 DIAGNOSIS — R339 Retention of urine, unspecified: Secondary | ICD-10-CM | POA: Diagnosis not present

## 2022-11-22 DIAGNOSIS — Z9289 Personal history of other medical treatment: Secondary | ICD-10-CM | POA: Diagnosis not present

## 2022-11-27 DIAGNOSIS — R627 Adult failure to thrive: Secondary | ICD-10-CM | POA: Diagnosis not present

## 2022-11-27 DIAGNOSIS — D179 Benign lipomatous neoplasm, unspecified: Secondary | ICD-10-CM | POA: Diagnosis not present

## 2022-11-27 DIAGNOSIS — R5381 Other malaise: Secondary | ICD-10-CM | POA: Diagnosis not present

## 2022-11-27 DIAGNOSIS — R339 Retention of urine, unspecified: Secondary | ICD-10-CM | POA: Diagnosis not present

## 2022-11-29 DIAGNOSIS — R278 Other lack of coordination: Secondary | ICD-10-CM | POA: Diagnosis not present

## 2022-11-29 DIAGNOSIS — R2689 Other abnormalities of gait and mobility: Secondary | ICD-10-CM | POA: Diagnosis not present

## 2022-11-29 DIAGNOSIS — F0390 Unspecified dementia without behavioral disturbance: Secondary | ICD-10-CM | POA: Diagnosis not present

## 2022-11-29 DIAGNOSIS — M6281 Muscle weakness (generalized): Secondary | ICD-10-CM | POA: Diagnosis not present

## 2022-11-30 DIAGNOSIS — M6281 Muscle weakness (generalized): Secondary | ICD-10-CM | POA: Diagnosis not present

## 2022-11-30 DIAGNOSIS — R278 Other lack of coordination: Secondary | ICD-10-CM | POA: Diagnosis not present

## 2022-11-30 DIAGNOSIS — F0390 Unspecified dementia without behavioral disturbance: Secondary | ICD-10-CM | POA: Diagnosis not present

## 2022-12-01 DIAGNOSIS — R278 Other lack of coordination: Secondary | ICD-10-CM | POA: Diagnosis not present

## 2022-12-01 DIAGNOSIS — F0390 Unspecified dementia without behavioral disturbance: Secondary | ICD-10-CM | POA: Diagnosis not present

## 2022-12-01 DIAGNOSIS — M6281 Muscle weakness (generalized): Secondary | ICD-10-CM | POA: Diagnosis not present

## 2022-12-04 DIAGNOSIS — M6281 Muscle weakness (generalized): Secondary | ICD-10-CM | POA: Diagnosis not present

## 2022-12-04 DIAGNOSIS — R278 Other lack of coordination: Secondary | ICD-10-CM | POA: Diagnosis not present

## 2022-12-04 DIAGNOSIS — F0390 Unspecified dementia without behavioral disturbance: Secondary | ICD-10-CM | POA: Diagnosis not present

## 2022-12-04 NOTE — Progress Notes (Signed)
Patient evaluated for infectious and viral illness and thus a viral panel was sent.

## 2022-12-05 DIAGNOSIS — R278 Other lack of coordination: Secondary | ICD-10-CM | POA: Diagnosis not present

## 2022-12-05 DIAGNOSIS — F0390 Unspecified dementia without behavioral disturbance: Secondary | ICD-10-CM | POA: Diagnosis not present

## 2022-12-05 DIAGNOSIS — M6281 Muscle weakness (generalized): Secondary | ICD-10-CM | POA: Diagnosis not present

## 2022-12-06 DIAGNOSIS — F0390 Unspecified dementia without behavioral disturbance: Secondary | ICD-10-CM | POA: Diagnosis not present

## 2022-12-06 DIAGNOSIS — M6281 Muscle weakness (generalized): Secondary | ICD-10-CM | POA: Diagnosis not present

## 2022-12-06 DIAGNOSIS — R278 Other lack of coordination: Secondary | ICD-10-CM | POA: Diagnosis not present

## 2022-12-07 DIAGNOSIS — M6281 Muscle weakness (generalized): Secondary | ICD-10-CM | POA: Diagnosis not present

## 2022-12-07 DIAGNOSIS — R278 Other lack of coordination: Secondary | ICD-10-CM | POA: Diagnosis not present

## 2022-12-07 DIAGNOSIS — F0390 Unspecified dementia without behavioral disturbance: Secondary | ICD-10-CM | POA: Diagnosis not present

## 2022-12-08 DIAGNOSIS — M6281 Muscle weakness (generalized): Secondary | ICD-10-CM | POA: Diagnosis not present

## 2022-12-08 DIAGNOSIS — R278 Other lack of coordination: Secondary | ICD-10-CM | POA: Diagnosis not present

## 2022-12-08 DIAGNOSIS — F0390 Unspecified dementia without behavioral disturbance: Secondary | ICD-10-CM | POA: Diagnosis not present

## 2022-12-11 DIAGNOSIS — R278 Other lack of coordination: Secondary | ICD-10-CM | POA: Diagnosis not present

## 2022-12-11 DIAGNOSIS — M6281 Muscle weakness (generalized): Secondary | ICD-10-CM | POA: Diagnosis not present

## 2022-12-11 DIAGNOSIS — F0390 Unspecified dementia without behavioral disturbance: Secondary | ICD-10-CM | POA: Diagnosis not present

## 2022-12-12 DIAGNOSIS — F0390 Unspecified dementia without behavioral disturbance: Secondary | ICD-10-CM | POA: Diagnosis not present

## 2022-12-12 DIAGNOSIS — R278 Other lack of coordination: Secondary | ICD-10-CM | POA: Diagnosis not present

## 2022-12-12 DIAGNOSIS — M6281 Muscle weakness (generalized): Secondary | ICD-10-CM | POA: Diagnosis not present

## 2022-12-13 DIAGNOSIS — F0390 Unspecified dementia without behavioral disturbance: Secondary | ICD-10-CM | POA: Diagnosis not present

## 2022-12-13 DIAGNOSIS — R278 Other lack of coordination: Secondary | ICD-10-CM | POA: Diagnosis not present

## 2022-12-13 DIAGNOSIS — M6281 Muscle weakness (generalized): Secondary | ICD-10-CM | POA: Diagnosis not present

## 2022-12-14 DIAGNOSIS — R278 Other lack of coordination: Secondary | ICD-10-CM | POA: Diagnosis not present

## 2022-12-14 DIAGNOSIS — F0390 Unspecified dementia without behavioral disturbance: Secondary | ICD-10-CM | POA: Diagnosis not present

## 2022-12-14 DIAGNOSIS — M6281 Muscle weakness (generalized): Secondary | ICD-10-CM | POA: Diagnosis not present

## 2022-12-15 DIAGNOSIS — F0390 Unspecified dementia without behavioral disturbance: Secondary | ICD-10-CM | POA: Diagnosis not present

## 2022-12-15 DIAGNOSIS — R278 Other lack of coordination: Secondary | ICD-10-CM | POA: Diagnosis not present

## 2022-12-15 DIAGNOSIS — M6281 Muscle weakness (generalized): Secondary | ICD-10-CM | POA: Diagnosis not present

## 2022-12-18 DIAGNOSIS — E1159 Type 2 diabetes mellitus with other circulatory complications: Secondary | ICD-10-CM | POA: Diagnosis not present

## 2022-12-18 DIAGNOSIS — M6281 Muscle weakness (generalized): Secondary | ICD-10-CM | POA: Diagnosis not present

## 2022-12-18 DIAGNOSIS — F03C3 Unspecified dementia, severe, with mood disturbance: Secondary | ICD-10-CM | POA: Diagnosis not present

## 2022-12-18 DIAGNOSIS — R278 Other lack of coordination: Secondary | ICD-10-CM | POA: Diagnosis not present

## 2022-12-18 DIAGNOSIS — I152 Hypertension secondary to endocrine disorders: Secondary | ICD-10-CM | POA: Diagnosis not present

## 2022-12-18 DIAGNOSIS — F39 Unspecified mood [affective] disorder: Secondary | ICD-10-CM | POA: Diagnosis not present

## 2022-12-18 DIAGNOSIS — F0390 Unspecified dementia without behavioral disturbance: Secondary | ICD-10-CM | POA: Diagnosis not present

## 2022-12-19 DIAGNOSIS — R278 Other lack of coordination: Secondary | ICD-10-CM | POA: Diagnosis not present

## 2022-12-19 DIAGNOSIS — M6281 Muscle weakness (generalized): Secondary | ICD-10-CM | POA: Diagnosis not present

## 2022-12-19 DIAGNOSIS — F0390 Unspecified dementia without behavioral disturbance: Secondary | ICD-10-CM | POA: Diagnosis not present

## 2022-12-20 DIAGNOSIS — R278 Other lack of coordination: Secondary | ICD-10-CM | POA: Diagnosis not present

## 2022-12-20 DIAGNOSIS — F0390 Unspecified dementia without behavioral disturbance: Secondary | ICD-10-CM | POA: Diagnosis not present

## 2022-12-20 DIAGNOSIS — M6281 Muscle weakness (generalized): Secondary | ICD-10-CM | POA: Diagnosis not present

## 2022-12-21 DIAGNOSIS — M6281 Muscle weakness (generalized): Secondary | ICD-10-CM | POA: Diagnosis not present

## 2022-12-21 DIAGNOSIS — R278 Other lack of coordination: Secondary | ICD-10-CM | POA: Diagnosis not present

## 2022-12-21 DIAGNOSIS — F0390 Unspecified dementia without behavioral disturbance: Secondary | ICD-10-CM | POA: Diagnosis not present

## 2022-12-22 DIAGNOSIS — R278 Other lack of coordination: Secondary | ICD-10-CM | POA: Diagnosis not present

## 2022-12-22 DIAGNOSIS — M6281 Muscle weakness (generalized): Secondary | ICD-10-CM | POA: Diagnosis not present

## 2022-12-22 DIAGNOSIS — F0390 Unspecified dementia without behavioral disturbance: Secondary | ICD-10-CM | POA: Diagnosis not present

## 2022-12-25 DIAGNOSIS — F0390 Unspecified dementia without behavioral disturbance: Secondary | ICD-10-CM | POA: Diagnosis not present

## 2022-12-25 DIAGNOSIS — M6281 Muscle weakness (generalized): Secondary | ICD-10-CM | POA: Diagnosis not present

## 2022-12-25 DIAGNOSIS — R278 Other lack of coordination: Secondary | ICD-10-CM | POA: Diagnosis not present

## 2022-12-26 DIAGNOSIS — R278 Other lack of coordination: Secondary | ICD-10-CM | POA: Diagnosis not present

## 2022-12-26 DIAGNOSIS — M6281 Muscle weakness (generalized): Secondary | ICD-10-CM | POA: Diagnosis not present

## 2022-12-26 DIAGNOSIS — F0390 Unspecified dementia without behavioral disturbance: Secondary | ICD-10-CM | POA: Diagnosis not present

## 2022-12-27 DIAGNOSIS — M6281 Muscle weakness (generalized): Secondary | ICD-10-CM | POA: Diagnosis not present

## 2022-12-27 DIAGNOSIS — F0390 Unspecified dementia without behavioral disturbance: Secondary | ICD-10-CM | POA: Diagnosis not present

## 2022-12-27 DIAGNOSIS — R278 Other lack of coordination: Secondary | ICD-10-CM | POA: Diagnosis not present

## 2022-12-28 DIAGNOSIS — M6281 Muscle weakness (generalized): Secondary | ICD-10-CM | POA: Diagnosis not present

## 2022-12-28 DIAGNOSIS — F0390 Unspecified dementia without behavioral disturbance: Secondary | ICD-10-CM | POA: Diagnosis not present

## 2022-12-28 DIAGNOSIS — R278 Other lack of coordination: Secondary | ICD-10-CM | POA: Diagnosis not present

## 2022-12-29 DIAGNOSIS — M6281 Muscle weakness (generalized): Secondary | ICD-10-CM | POA: Diagnosis not present

## 2022-12-29 DIAGNOSIS — R2689 Other abnormalities of gait and mobility: Secondary | ICD-10-CM | POA: Diagnosis not present

## 2022-12-29 DIAGNOSIS — F0390 Unspecified dementia without behavioral disturbance: Secondary | ICD-10-CM | POA: Diagnosis not present

## 2023-01-01 DIAGNOSIS — I152 Hypertension secondary to endocrine disorders: Secondary | ICD-10-CM | POA: Diagnosis not present

## 2023-01-01 DIAGNOSIS — R2689 Other abnormalities of gait and mobility: Secondary | ICD-10-CM | POA: Diagnosis not present

## 2023-01-01 DIAGNOSIS — F0390 Unspecified dementia without behavioral disturbance: Secondary | ICD-10-CM | POA: Diagnosis not present

## 2023-01-01 DIAGNOSIS — Z515 Encounter for palliative care: Secondary | ICD-10-CM | POA: Diagnosis not present

## 2023-01-01 DIAGNOSIS — F03C3 Unspecified dementia, severe, with mood disturbance: Secondary | ICD-10-CM | POA: Diagnosis not present

## 2023-01-01 DIAGNOSIS — E1159 Type 2 diabetes mellitus with other circulatory complications: Secondary | ICD-10-CM | POA: Diagnosis not present

## 2023-01-01 DIAGNOSIS — M6281 Muscle weakness (generalized): Secondary | ICD-10-CM | POA: Diagnosis not present

## 2023-01-01 DIAGNOSIS — F39 Unspecified mood [affective] disorder: Secondary | ICD-10-CM | POA: Diagnosis not present

## 2023-01-01 DIAGNOSIS — R627 Adult failure to thrive: Secondary | ICD-10-CM | POA: Diagnosis not present

## 2023-01-02 DIAGNOSIS — F0390 Unspecified dementia without behavioral disturbance: Secondary | ICD-10-CM | POA: Diagnosis not present

## 2023-01-02 DIAGNOSIS — R2689 Other abnormalities of gait and mobility: Secondary | ICD-10-CM | POA: Diagnosis not present

## 2023-01-02 DIAGNOSIS — M6281 Muscle weakness (generalized): Secondary | ICD-10-CM | POA: Diagnosis not present

## 2023-01-03 DIAGNOSIS — F0390 Unspecified dementia without behavioral disturbance: Secondary | ICD-10-CM | POA: Diagnosis not present

## 2023-01-03 DIAGNOSIS — R2689 Other abnormalities of gait and mobility: Secondary | ICD-10-CM | POA: Diagnosis not present

## 2023-01-03 DIAGNOSIS — M6281 Muscle weakness (generalized): Secondary | ICD-10-CM | POA: Diagnosis not present

## 2023-01-04 DIAGNOSIS — F0390 Unspecified dementia without behavioral disturbance: Secondary | ICD-10-CM | POA: Diagnosis not present

## 2023-01-04 DIAGNOSIS — M6281 Muscle weakness (generalized): Secondary | ICD-10-CM | POA: Diagnosis not present

## 2023-01-04 DIAGNOSIS — R2689 Other abnormalities of gait and mobility: Secondary | ICD-10-CM | POA: Diagnosis not present

## 2023-01-05 DIAGNOSIS — M6281 Muscle weakness (generalized): Secondary | ICD-10-CM | POA: Diagnosis not present

## 2023-01-05 DIAGNOSIS — R2689 Other abnormalities of gait and mobility: Secondary | ICD-10-CM | POA: Diagnosis not present

## 2023-01-05 DIAGNOSIS — F0390 Unspecified dementia without behavioral disturbance: Secondary | ICD-10-CM | POA: Diagnosis not present

## 2023-01-08 DIAGNOSIS — M6281 Muscle weakness (generalized): Secondary | ICD-10-CM | POA: Diagnosis not present

## 2023-01-08 DIAGNOSIS — F0390 Unspecified dementia without behavioral disturbance: Secondary | ICD-10-CM | POA: Diagnosis not present

## 2023-01-08 DIAGNOSIS — R2689 Other abnormalities of gait and mobility: Secondary | ICD-10-CM | POA: Diagnosis not present

## 2023-01-09 DIAGNOSIS — M6281 Muscle weakness (generalized): Secondary | ICD-10-CM | POA: Diagnosis not present

## 2023-01-09 DIAGNOSIS — F0390 Unspecified dementia without behavioral disturbance: Secondary | ICD-10-CM | POA: Diagnosis not present

## 2023-01-09 DIAGNOSIS — R2689 Other abnormalities of gait and mobility: Secondary | ICD-10-CM | POA: Diagnosis not present

## 2023-01-10 DIAGNOSIS — M6281 Muscle weakness (generalized): Secondary | ICD-10-CM | POA: Diagnosis not present

## 2023-01-10 DIAGNOSIS — F0390 Unspecified dementia without behavioral disturbance: Secondary | ICD-10-CM | POA: Diagnosis not present

## 2023-01-10 DIAGNOSIS — R2689 Other abnormalities of gait and mobility: Secondary | ICD-10-CM | POA: Diagnosis not present

## 2023-01-11 DIAGNOSIS — M6281 Muscle weakness (generalized): Secondary | ICD-10-CM | POA: Diagnosis not present

## 2023-01-11 DIAGNOSIS — R2689 Other abnormalities of gait and mobility: Secondary | ICD-10-CM | POA: Diagnosis not present

## 2023-01-11 DIAGNOSIS — F0390 Unspecified dementia without behavioral disturbance: Secondary | ICD-10-CM | POA: Diagnosis not present

## 2023-01-12 DIAGNOSIS — R2689 Other abnormalities of gait and mobility: Secondary | ICD-10-CM | POA: Diagnosis not present

## 2023-01-12 DIAGNOSIS — F0390 Unspecified dementia without behavioral disturbance: Secondary | ICD-10-CM | POA: Diagnosis not present

## 2023-01-12 DIAGNOSIS — M6281 Muscle weakness (generalized): Secondary | ICD-10-CM | POA: Diagnosis not present

## 2023-01-15 DIAGNOSIS — R2689 Other abnormalities of gait and mobility: Secondary | ICD-10-CM | POA: Diagnosis not present

## 2023-01-15 DIAGNOSIS — M6281 Muscle weakness (generalized): Secondary | ICD-10-CM | POA: Diagnosis not present

## 2023-01-15 DIAGNOSIS — F0390 Unspecified dementia without behavioral disturbance: Secondary | ICD-10-CM | POA: Diagnosis not present

## 2023-01-16 DIAGNOSIS — M6281 Muscle weakness (generalized): Secondary | ICD-10-CM | POA: Diagnosis not present

## 2023-01-16 DIAGNOSIS — F0390 Unspecified dementia without behavioral disturbance: Secondary | ICD-10-CM | POA: Diagnosis not present

## 2023-01-16 DIAGNOSIS — R2689 Other abnormalities of gait and mobility: Secondary | ICD-10-CM | POA: Diagnosis not present

## 2023-02-07 DIAGNOSIS — E1159 Type 2 diabetes mellitus with other circulatory complications: Secondary | ICD-10-CM | POA: Diagnosis not present

## 2023-02-07 DIAGNOSIS — I152 Hypertension secondary to endocrine disorders: Secondary | ICD-10-CM | POA: Diagnosis not present

## 2023-02-07 DIAGNOSIS — F03C3 Unspecified dementia, severe, with mood disturbance: Secondary | ICD-10-CM | POA: Diagnosis not present

## 2023-02-07 DIAGNOSIS — F39 Unspecified mood [affective] disorder: Secondary | ICD-10-CM | POA: Diagnosis not present

## 2023-02-07 DIAGNOSIS — F03C4 Unspecified dementia, severe, with anxiety: Secondary | ICD-10-CM | POA: Diagnosis not present

## 2023-04-06 DIAGNOSIS — F0390 Unspecified dementia without behavioral disturbance: Secondary | ICD-10-CM | POA: Diagnosis not present

## 2023-04-06 DIAGNOSIS — R2681 Unsteadiness on feet: Secondary | ICD-10-CM | POA: Diagnosis not present

## 2023-04-06 DIAGNOSIS — M6281 Muscle weakness (generalized): Secondary | ICD-10-CM | POA: Diagnosis not present

## 2023-04-08 DIAGNOSIS — R2681 Unsteadiness on feet: Secondary | ICD-10-CM | POA: Diagnosis not present

## 2023-04-08 DIAGNOSIS — M6281 Muscle weakness (generalized): Secondary | ICD-10-CM | POA: Diagnosis not present

## 2023-04-08 DIAGNOSIS — F0390 Unspecified dementia without behavioral disturbance: Secondary | ICD-10-CM | POA: Diagnosis not present

## 2023-04-09 DIAGNOSIS — R627 Adult failure to thrive: Secondary | ICD-10-CM | POA: Diagnosis not present

## 2023-04-09 DIAGNOSIS — F03C3 Unspecified dementia, severe, with mood disturbance: Secondary | ICD-10-CM | POA: Diagnosis not present

## 2023-04-09 DIAGNOSIS — I152 Hypertension secondary to endocrine disorders: Secondary | ICD-10-CM | POA: Diagnosis not present

## 2023-04-09 DIAGNOSIS — F39 Unspecified mood [affective] disorder: Secondary | ICD-10-CM | POA: Diagnosis not present

## 2023-04-09 DIAGNOSIS — F0390 Unspecified dementia without behavioral disturbance: Secondary | ICD-10-CM | POA: Diagnosis not present

## 2023-04-09 DIAGNOSIS — E119 Type 2 diabetes mellitus without complications: Secondary | ICD-10-CM | POA: Diagnosis not present

## 2023-04-09 DIAGNOSIS — Z515 Encounter for palliative care: Secondary | ICD-10-CM | POA: Diagnosis not present

## 2023-04-09 DIAGNOSIS — R2681 Unsteadiness on feet: Secondary | ICD-10-CM | POA: Diagnosis not present

## 2023-04-09 DIAGNOSIS — M6281 Muscle weakness (generalized): Secondary | ICD-10-CM | POA: Diagnosis not present

## 2023-04-10 DIAGNOSIS — F0390 Unspecified dementia without behavioral disturbance: Secondary | ICD-10-CM | POA: Diagnosis not present

## 2023-04-10 DIAGNOSIS — M6281 Muscle weakness (generalized): Secondary | ICD-10-CM | POA: Diagnosis not present

## 2023-04-10 DIAGNOSIS — R2681 Unsteadiness on feet: Secondary | ICD-10-CM | POA: Diagnosis not present

## 2023-04-11 DIAGNOSIS — R2681 Unsteadiness on feet: Secondary | ICD-10-CM | POA: Diagnosis not present

## 2023-04-11 DIAGNOSIS — F0390 Unspecified dementia without behavioral disturbance: Secondary | ICD-10-CM | POA: Diagnosis not present

## 2023-04-11 DIAGNOSIS — M6281 Muscle weakness (generalized): Secondary | ICD-10-CM | POA: Diagnosis not present

## 2023-04-13 DIAGNOSIS — R2681 Unsteadiness on feet: Secondary | ICD-10-CM | POA: Diagnosis not present

## 2023-04-13 DIAGNOSIS — Z0000 Encounter for general adult medical examination without abnormal findings: Secondary | ICD-10-CM | POA: Diagnosis not present

## 2023-04-13 DIAGNOSIS — D509 Iron deficiency anemia, unspecified: Secondary | ICD-10-CM | POA: Diagnosis not present

## 2023-04-13 DIAGNOSIS — E559 Vitamin D deficiency, unspecified: Secondary | ICD-10-CM | POA: Diagnosis not present

## 2023-04-13 DIAGNOSIS — E1122 Type 2 diabetes mellitus with diabetic chronic kidney disease: Secondary | ICD-10-CM | POA: Diagnosis not present

## 2023-04-13 DIAGNOSIS — D518 Other vitamin B12 deficiency anemias: Secondary | ICD-10-CM | POA: Diagnosis not present

## 2023-04-13 DIAGNOSIS — M6281 Muscle weakness (generalized): Secondary | ICD-10-CM | POA: Diagnosis not present

## 2023-04-13 DIAGNOSIS — E785 Hyperlipidemia, unspecified: Secondary | ICD-10-CM | POA: Diagnosis not present

## 2023-04-13 DIAGNOSIS — D528 Other folate deficiency anemias: Secondary | ICD-10-CM | POA: Diagnosis not present

## 2023-04-13 DIAGNOSIS — F0390 Unspecified dementia without behavioral disturbance: Secondary | ICD-10-CM | POA: Diagnosis not present

## 2023-04-16 DIAGNOSIS — F0390 Unspecified dementia without behavioral disturbance: Secondary | ICD-10-CM | POA: Diagnosis not present

## 2023-04-16 DIAGNOSIS — R2681 Unsteadiness on feet: Secondary | ICD-10-CM | POA: Diagnosis not present

## 2023-04-16 DIAGNOSIS — M6281 Muscle weakness (generalized): Secondary | ICD-10-CM | POA: Diagnosis not present

## 2023-04-17 DIAGNOSIS — F0390 Unspecified dementia without behavioral disturbance: Secondary | ICD-10-CM | POA: Diagnosis not present

## 2023-04-17 DIAGNOSIS — M6281 Muscle weakness (generalized): Secondary | ICD-10-CM | POA: Diagnosis not present

## 2023-04-17 DIAGNOSIS — R2681 Unsteadiness on feet: Secondary | ICD-10-CM | POA: Diagnosis not present

## 2023-04-18 DIAGNOSIS — M6281 Muscle weakness (generalized): Secondary | ICD-10-CM | POA: Diagnosis not present

## 2023-04-18 DIAGNOSIS — F0390 Unspecified dementia without behavioral disturbance: Secondary | ICD-10-CM | POA: Diagnosis not present

## 2023-04-18 DIAGNOSIS — R2681 Unsteadiness on feet: Secondary | ICD-10-CM | POA: Diagnosis not present

## 2023-04-19 DIAGNOSIS — R2681 Unsteadiness on feet: Secondary | ICD-10-CM | POA: Diagnosis not present

## 2023-04-19 DIAGNOSIS — M6281 Muscle weakness (generalized): Secondary | ICD-10-CM | POA: Diagnosis not present

## 2023-04-19 DIAGNOSIS — F0390 Unspecified dementia without behavioral disturbance: Secondary | ICD-10-CM | POA: Diagnosis not present

## 2023-04-20 DIAGNOSIS — F0390 Unspecified dementia without behavioral disturbance: Secondary | ICD-10-CM | POA: Diagnosis not present

## 2023-04-20 DIAGNOSIS — R2681 Unsteadiness on feet: Secondary | ICD-10-CM | POA: Diagnosis not present

## 2023-04-20 DIAGNOSIS — M6281 Muscle weakness (generalized): Secondary | ICD-10-CM | POA: Diagnosis not present

## 2023-04-23 DIAGNOSIS — E539 Vitamin B deficiency, unspecified: Secondary | ICD-10-CM | POA: Diagnosis not present

## 2023-04-23 DIAGNOSIS — F0390 Unspecified dementia without behavioral disturbance: Secondary | ICD-10-CM | POA: Diagnosis not present

## 2023-04-23 DIAGNOSIS — M6281 Muscle weakness (generalized): Secondary | ICD-10-CM | POA: Diagnosis not present

## 2023-04-23 DIAGNOSIS — D170 Benign lipomatous neoplasm of skin and subcutaneous tissue of head, face and neck: Secondary | ICD-10-CM | POA: Diagnosis not present

## 2023-04-23 DIAGNOSIS — R2681 Unsteadiness on feet: Secondary | ICD-10-CM | POA: Diagnosis not present

## 2023-04-23 DIAGNOSIS — E559 Vitamin D deficiency, unspecified: Secondary | ICD-10-CM | POA: Diagnosis not present

## 2023-04-23 DIAGNOSIS — E785 Hyperlipidemia, unspecified: Secondary | ICD-10-CM | POA: Diagnosis not present

## 2023-04-24 DIAGNOSIS — R2681 Unsteadiness on feet: Secondary | ICD-10-CM | POA: Diagnosis not present

## 2023-04-24 DIAGNOSIS — F0390 Unspecified dementia without behavioral disturbance: Secondary | ICD-10-CM | POA: Diagnosis not present

## 2023-04-24 DIAGNOSIS — M6281 Muscle weakness (generalized): Secondary | ICD-10-CM | POA: Diagnosis not present

## 2023-04-25 DIAGNOSIS — R2681 Unsteadiness on feet: Secondary | ICD-10-CM | POA: Diagnosis not present

## 2023-04-25 DIAGNOSIS — M6281 Muscle weakness (generalized): Secondary | ICD-10-CM | POA: Diagnosis not present

## 2023-04-25 DIAGNOSIS — F0390 Unspecified dementia without behavioral disturbance: Secondary | ICD-10-CM | POA: Diagnosis not present

## 2023-04-26 DIAGNOSIS — F0390 Unspecified dementia without behavioral disturbance: Secondary | ICD-10-CM | POA: Diagnosis not present

## 2023-04-26 DIAGNOSIS — M6281 Muscle weakness (generalized): Secondary | ICD-10-CM | POA: Diagnosis not present

## 2023-04-26 DIAGNOSIS — R2681 Unsteadiness on feet: Secondary | ICD-10-CM | POA: Diagnosis not present

## 2023-04-27 DIAGNOSIS — F0390 Unspecified dementia without behavioral disturbance: Secondary | ICD-10-CM | POA: Diagnosis not present

## 2023-04-27 DIAGNOSIS — M6281 Muscle weakness (generalized): Secondary | ICD-10-CM | POA: Diagnosis not present

## 2023-04-27 DIAGNOSIS — R2681 Unsteadiness on feet: Secondary | ICD-10-CM | POA: Diagnosis not present

## 2023-04-29 DIAGNOSIS — R2681 Unsteadiness on feet: Secondary | ICD-10-CM | POA: Diagnosis not present

## 2023-04-29 DIAGNOSIS — F0390 Unspecified dementia without behavioral disturbance: Secondary | ICD-10-CM | POA: Diagnosis not present

## 2023-04-29 DIAGNOSIS — M6281 Muscle weakness (generalized): Secondary | ICD-10-CM | POA: Diagnosis not present

## 2023-04-30 DIAGNOSIS — F0390 Unspecified dementia without behavioral disturbance: Secondary | ICD-10-CM | POA: Diagnosis not present

## 2023-04-30 DIAGNOSIS — R2681 Unsteadiness on feet: Secondary | ICD-10-CM | POA: Diagnosis not present

## 2023-04-30 DIAGNOSIS — M6281 Muscle weakness (generalized): Secondary | ICD-10-CM | POA: Diagnosis not present

## 2023-05-01 DIAGNOSIS — M6281 Muscle weakness (generalized): Secondary | ICD-10-CM | POA: Diagnosis not present

## 2023-05-01 DIAGNOSIS — F0390 Unspecified dementia without behavioral disturbance: Secondary | ICD-10-CM | POA: Diagnosis not present

## 2023-05-01 DIAGNOSIS — R2681 Unsteadiness on feet: Secondary | ICD-10-CM | POA: Diagnosis not present

## 2023-05-02 DIAGNOSIS — F0390 Unspecified dementia without behavioral disturbance: Secondary | ICD-10-CM | POA: Diagnosis not present

## 2023-05-02 DIAGNOSIS — R2681 Unsteadiness on feet: Secondary | ICD-10-CM | POA: Diagnosis not present

## 2023-05-02 DIAGNOSIS — M6281 Muscle weakness (generalized): Secondary | ICD-10-CM | POA: Diagnosis not present

## 2023-05-04 DIAGNOSIS — M6281 Muscle weakness (generalized): Secondary | ICD-10-CM | POA: Diagnosis not present

## 2023-05-04 DIAGNOSIS — F0390 Unspecified dementia without behavioral disturbance: Secondary | ICD-10-CM | POA: Diagnosis not present

## 2023-05-04 DIAGNOSIS — R2681 Unsteadiness on feet: Secondary | ICD-10-CM | POA: Diagnosis not present

## 2023-05-07 DIAGNOSIS — F0390 Unspecified dementia without behavioral disturbance: Secondary | ICD-10-CM | POA: Diagnosis not present

## 2023-05-07 DIAGNOSIS — R2681 Unsteadiness on feet: Secondary | ICD-10-CM | POA: Diagnosis not present

## 2023-05-07 DIAGNOSIS — M6281 Muscle weakness (generalized): Secondary | ICD-10-CM | POA: Diagnosis not present

## 2023-05-08 DIAGNOSIS — F0390 Unspecified dementia without behavioral disturbance: Secondary | ICD-10-CM | POA: Diagnosis not present

## 2023-05-08 DIAGNOSIS — M6281 Muscle weakness (generalized): Secondary | ICD-10-CM | POA: Diagnosis not present

## 2023-05-08 DIAGNOSIS — R2681 Unsteadiness on feet: Secondary | ICD-10-CM | POA: Diagnosis not present

## 2023-05-09 DIAGNOSIS — F0390 Unspecified dementia without behavioral disturbance: Secondary | ICD-10-CM | POA: Diagnosis not present

## 2023-05-09 DIAGNOSIS — R2681 Unsteadiness on feet: Secondary | ICD-10-CM | POA: Diagnosis not present

## 2023-05-09 DIAGNOSIS — M6281 Muscle weakness (generalized): Secondary | ICD-10-CM | POA: Diagnosis not present

## 2023-05-10 DIAGNOSIS — F0390 Unspecified dementia without behavioral disturbance: Secondary | ICD-10-CM | POA: Diagnosis not present

## 2023-05-10 DIAGNOSIS — R2681 Unsteadiness on feet: Secondary | ICD-10-CM | POA: Diagnosis not present

## 2023-05-10 DIAGNOSIS — M6281 Muscle weakness (generalized): Secondary | ICD-10-CM | POA: Diagnosis not present

## 2023-05-11 DIAGNOSIS — R2681 Unsteadiness on feet: Secondary | ICD-10-CM | POA: Diagnosis not present

## 2023-05-11 DIAGNOSIS — F0390 Unspecified dementia without behavioral disturbance: Secondary | ICD-10-CM | POA: Diagnosis not present

## 2023-05-11 DIAGNOSIS — M6281 Muscle weakness (generalized): Secondary | ICD-10-CM | POA: Diagnosis not present

## 2023-06-11 DIAGNOSIS — I152 Hypertension secondary to endocrine disorders: Secondary | ICD-10-CM | POA: Diagnosis not present

## 2023-06-11 DIAGNOSIS — E785 Hyperlipidemia, unspecified: Secondary | ICD-10-CM | POA: Diagnosis not present

## 2023-06-11 DIAGNOSIS — E1159 Type 2 diabetes mellitus with other circulatory complications: Secondary | ICD-10-CM | POA: Diagnosis not present

## 2023-06-11 DIAGNOSIS — F39 Unspecified mood [affective] disorder: Secondary | ICD-10-CM | POA: Diagnosis not present

## 2023-06-11 DIAGNOSIS — F03C3 Unspecified dementia, severe, with mood disturbance: Secondary | ICD-10-CM | POA: Diagnosis not present

## 2023-07-03 DIAGNOSIS — F0390 Unspecified dementia without behavioral disturbance: Secondary | ICD-10-CM | POA: Diagnosis not present

## 2023-07-03 DIAGNOSIS — R41841 Cognitive communication deficit: Secondary | ICD-10-CM | POA: Diagnosis not present

## 2023-07-03 DIAGNOSIS — R4182 Altered mental status, unspecified: Secondary | ICD-10-CM | POA: Diagnosis not present

## 2023-07-03 DIAGNOSIS — R2689 Other abnormalities of gait and mobility: Secondary | ICD-10-CM | POA: Diagnosis not present

## 2023-07-04 DIAGNOSIS — F0390 Unspecified dementia without behavioral disturbance: Secondary | ICD-10-CM | POA: Diagnosis not present

## 2023-07-04 DIAGNOSIS — R41841 Cognitive communication deficit: Secondary | ICD-10-CM | POA: Diagnosis not present

## 2023-07-04 DIAGNOSIS — R2689 Other abnormalities of gait and mobility: Secondary | ICD-10-CM | POA: Diagnosis not present

## 2023-07-04 DIAGNOSIS — R4182 Altered mental status, unspecified: Secondary | ICD-10-CM | POA: Diagnosis not present

## 2023-07-05 DIAGNOSIS — R2689 Other abnormalities of gait and mobility: Secondary | ICD-10-CM | POA: Diagnosis not present

## 2023-07-05 DIAGNOSIS — R41841 Cognitive communication deficit: Secondary | ICD-10-CM | POA: Diagnosis not present

## 2023-07-05 DIAGNOSIS — F0390 Unspecified dementia without behavioral disturbance: Secondary | ICD-10-CM | POA: Diagnosis not present

## 2023-07-05 DIAGNOSIS — R4182 Altered mental status, unspecified: Secondary | ICD-10-CM | POA: Diagnosis not present

## 2023-07-06 DIAGNOSIS — R41841 Cognitive communication deficit: Secondary | ICD-10-CM | POA: Diagnosis not present

## 2023-07-06 DIAGNOSIS — R4182 Altered mental status, unspecified: Secondary | ICD-10-CM | POA: Diagnosis not present

## 2023-07-06 DIAGNOSIS — F0390 Unspecified dementia without behavioral disturbance: Secondary | ICD-10-CM | POA: Diagnosis not present

## 2023-07-06 DIAGNOSIS — R2689 Other abnormalities of gait and mobility: Secondary | ICD-10-CM | POA: Diagnosis not present

## 2023-07-08 DIAGNOSIS — R4182 Altered mental status, unspecified: Secondary | ICD-10-CM | POA: Diagnosis not present

## 2023-07-08 DIAGNOSIS — F0390 Unspecified dementia without behavioral disturbance: Secondary | ICD-10-CM | POA: Diagnosis not present

## 2023-07-08 DIAGNOSIS — R41841 Cognitive communication deficit: Secondary | ICD-10-CM | POA: Diagnosis not present

## 2023-07-08 DIAGNOSIS — R2689 Other abnormalities of gait and mobility: Secondary | ICD-10-CM | POA: Diagnosis not present

## 2023-07-08 IMAGING — US US ABDOMINAL AORTA SCREENING AAA
1 series · 7 of 7 positions shown · non-contrast
Comparison: CT abdomen and pelvis-01/02/2011

CLINICAL DATA: Male between 65-75 years of age with a smoking
history.

EXAM:
US ABDOMINAL AORTA MEDICARE SCREENING
TECHNIQUE: Ultrasound examination of the abdominal aorta was performed as a
screening evaluation for abdominal aortic aneurysm.

[Series 1: us abdominal aorta screening aaa · 0.25mm/px · 7 of 7 slices shown]
[im 1/7]
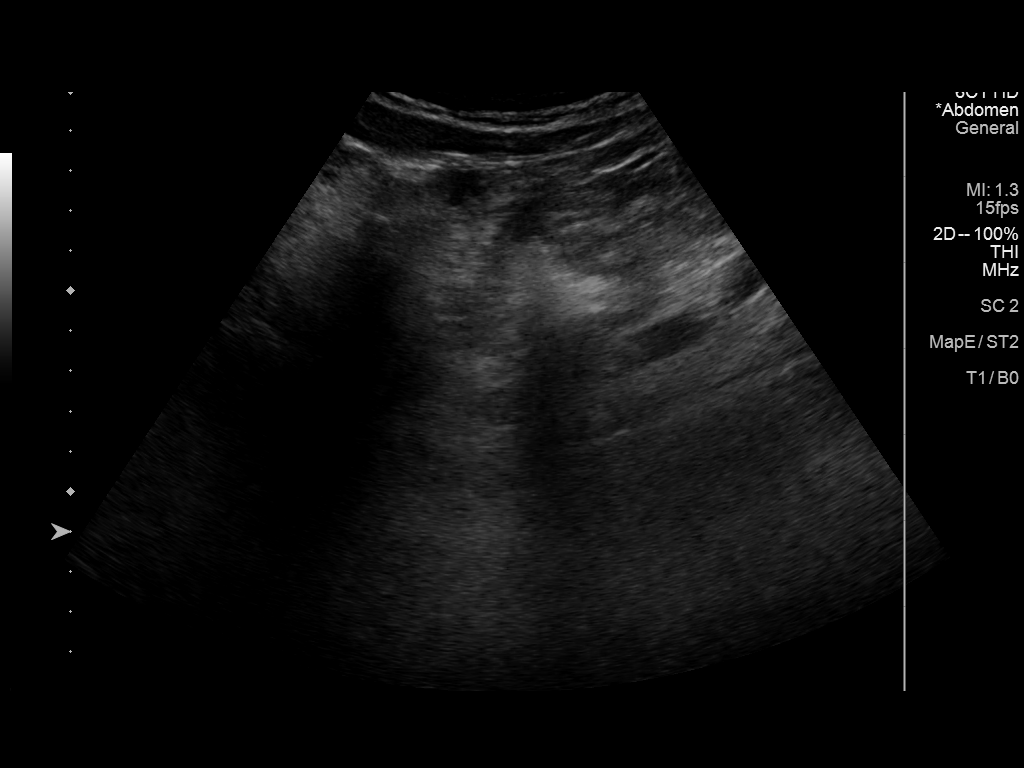
[im 2/7]
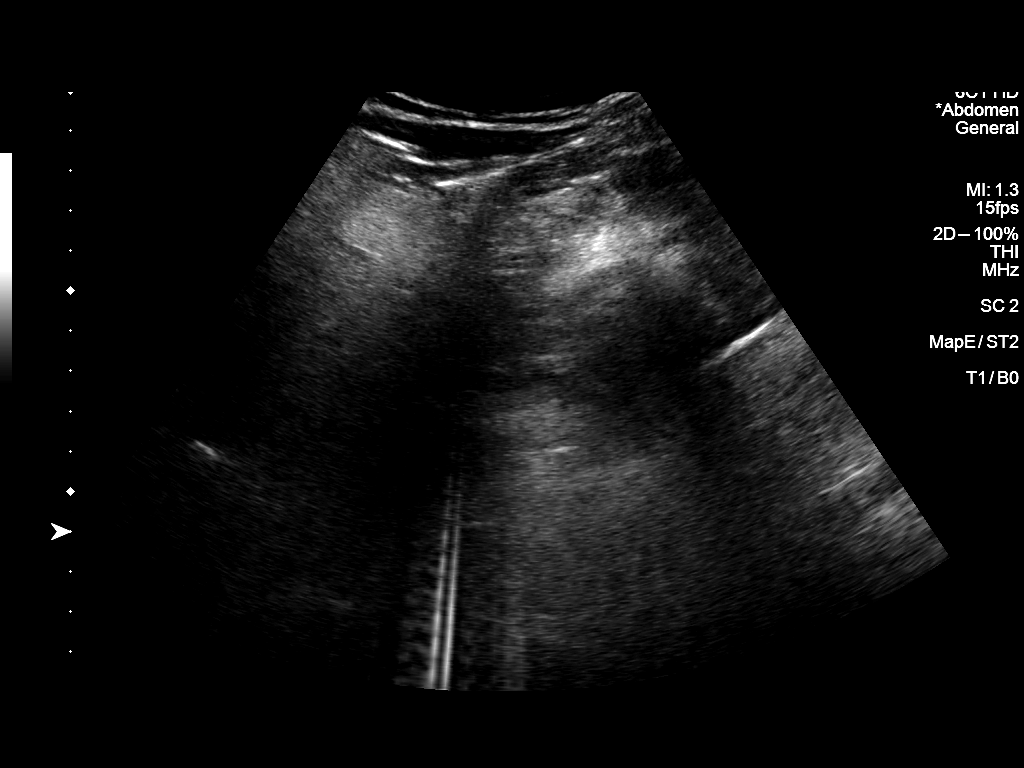
[im 3/7]
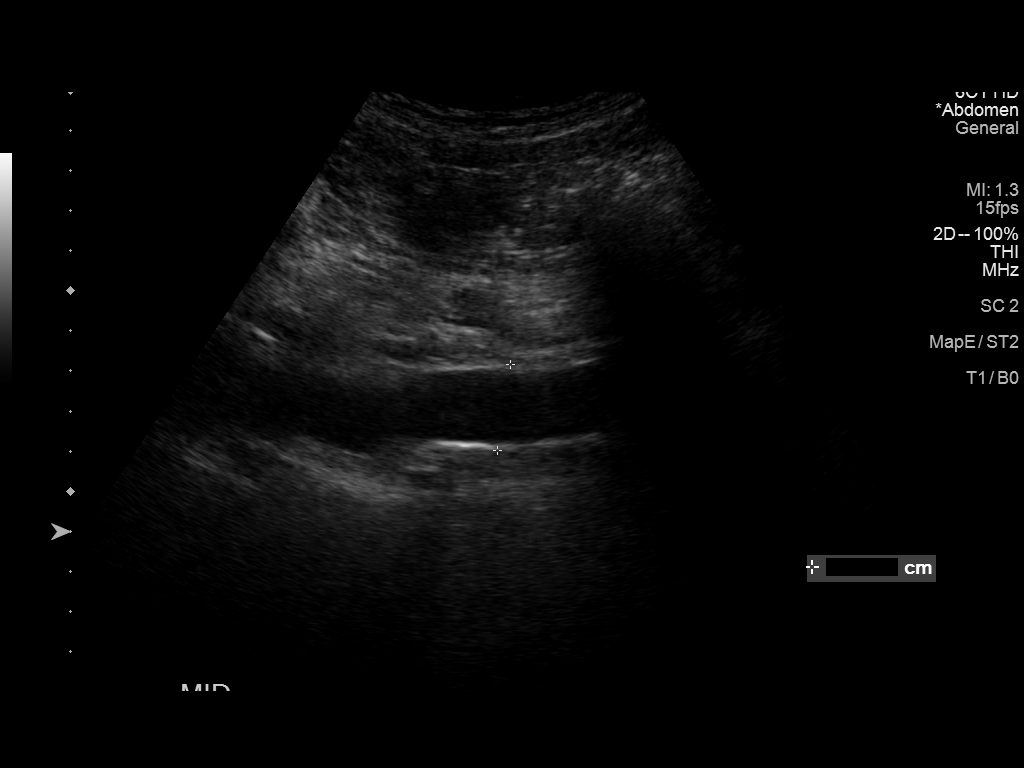
[im 4/7]
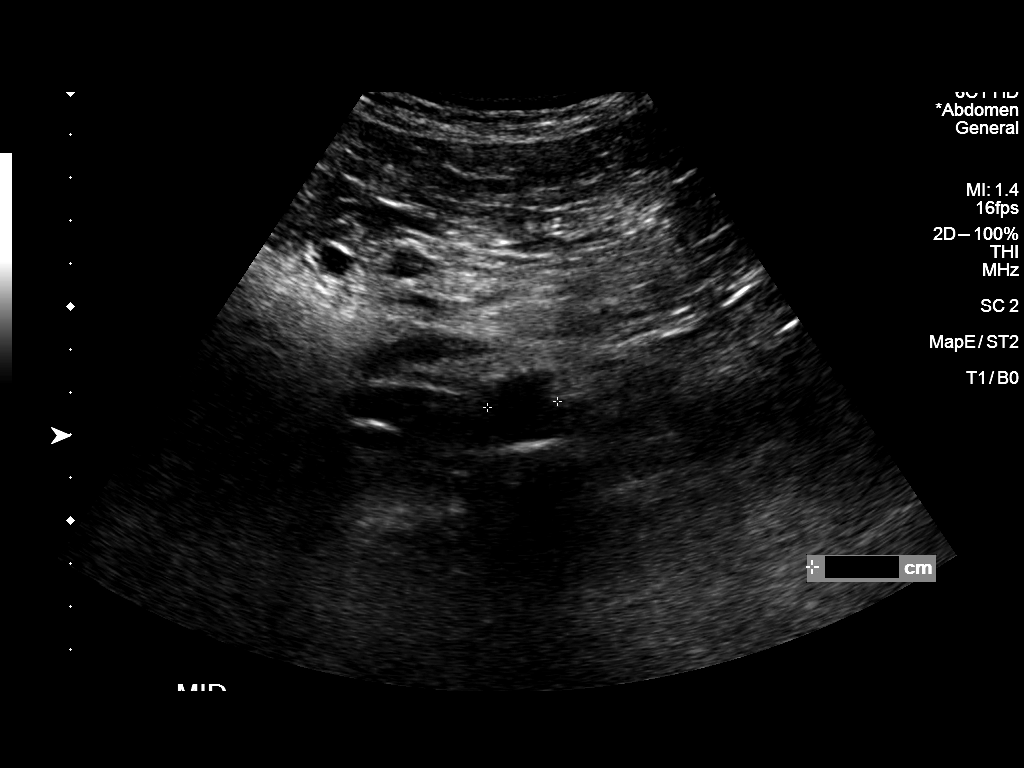
[im 5/7]
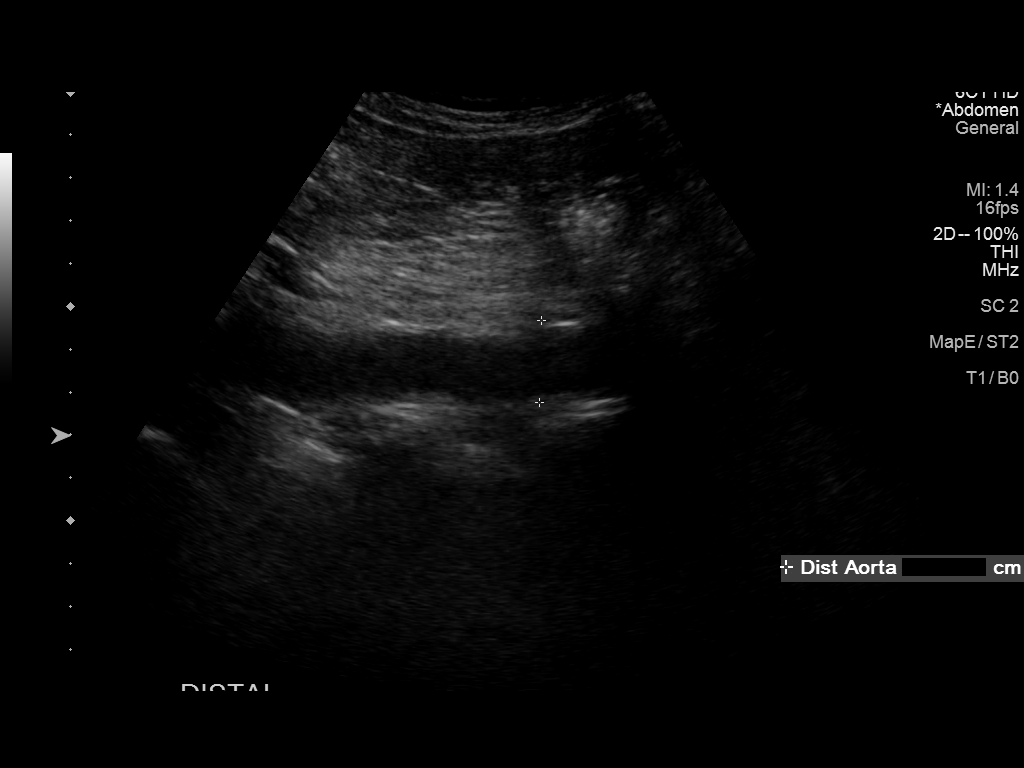
[im 6/7]
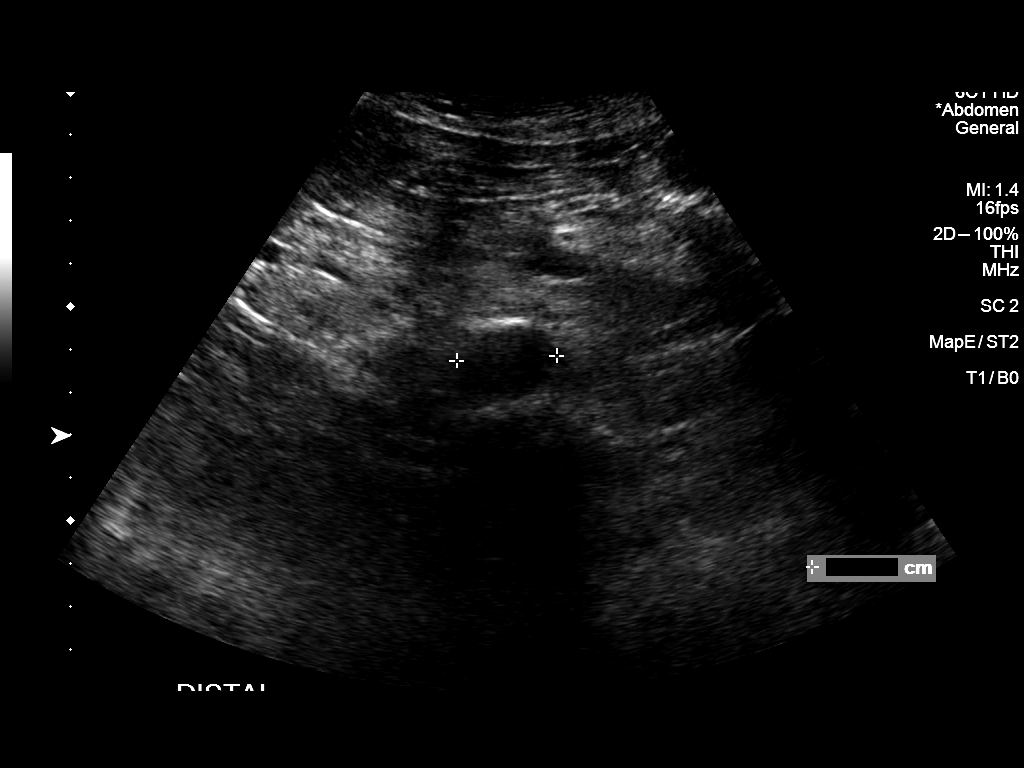
[im 7/7]
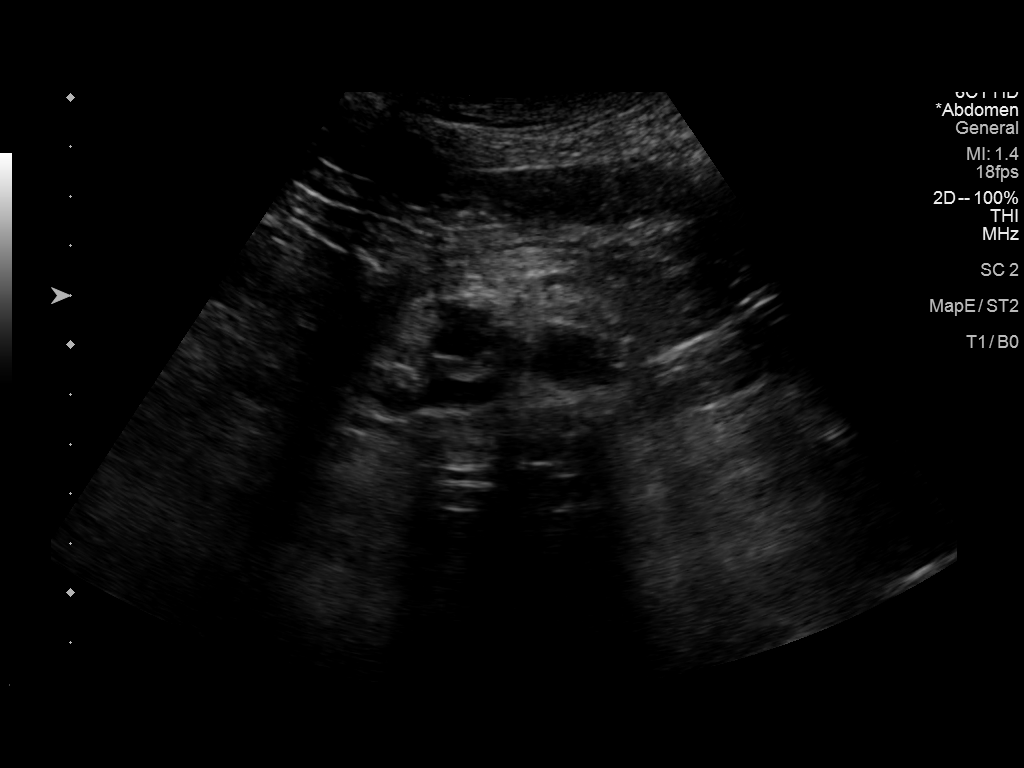

[7 of 7 positions shown; findings below may reference images not displayed]

FINDINGS: Abdominal aortic measurements as follows:

Proximal:  Obscured by bowel gas

Mid:  2.2 x 1.6 cm

Distal:  2.3 x 2.0 cm
IMPRESSION: No evidence of abdominal aortic aneurysm though note, visualization
of the proximal aspect of the abdominal aorta was obscured by bowel
gas.

## 2023-07-09 DIAGNOSIS — R41841 Cognitive communication deficit: Secondary | ICD-10-CM | POA: Diagnosis not present

## 2023-07-09 DIAGNOSIS — R4182 Altered mental status, unspecified: Secondary | ICD-10-CM | POA: Diagnosis not present

## 2023-07-09 DIAGNOSIS — R2689 Other abnormalities of gait and mobility: Secondary | ICD-10-CM | POA: Diagnosis not present

## 2023-07-09 DIAGNOSIS — F0390 Unspecified dementia without behavioral disturbance: Secondary | ICD-10-CM | POA: Diagnosis not present

## 2023-07-10 DIAGNOSIS — R2689 Other abnormalities of gait and mobility: Secondary | ICD-10-CM | POA: Diagnosis not present

## 2023-07-10 DIAGNOSIS — R4182 Altered mental status, unspecified: Secondary | ICD-10-CM | POA: Diagnosis not present

## 2023-07-10 DIAGNOSIS — F0390 Unspecified dementia without behavioral disturbance: Secondary | ICD-10-CM | POA: Diagnosis not present

## 2023-07-10 DIAGNOSIS — R41841 Cognitive communication deficit: Secondary | ICD-10-CM | POA: Diagnosis not present

## 2023-07-11 DIAGNOSIS — R4182 Altered mental status, unspecified: Secondary | ICD-10-CM | POA: Diagnosis not present

## 2023-07-11 DIAGNOSIS — R2689 Other abnormalities of gait and mobility: Secondary | ICD-10-CM | POA: Diagnosis not present

## 2023-07-11 DIAGNOSIS — F0390 Unspecified dementia without behavioral disturbance: Secondary | ICD-10-CM | POA: Diagnosis not present

## 2023-07-11 DIAGNOSIS — R41841 Cognitive communication deficit: Secondary | ICD-10-CM | POA: Diagnosis not present

## 2023-07-12 DIAGNOSIS — R2689 Other abnormalities of gait and mobility: Secondary | ICD-10-CM | POA: Diagnosis not present

## 2023-07-12 DIAGNOSIS — R41841 Cognitive communication deficit: Secondary | ICD-10-CM | POA: Diagnosis not present

## 2023-07-12 DIAGNOSIS — F0390 Unspecified dementia without behavioral disturbance: Secondary | ICD-10-CM | POA: Diagnosis not present

## 2023-07-12 DIAGNOSIS — R4182 Altered mental status, unspecified: Secondary | ICD-10-CM | POA: Diagnosis not present

## 2023-07-16 DIAGNOSIS — R41841 Cognitive communication deficit: Secondary | ICD-10-CM | POA: Diagnosis not present

## 2023-07-16 DIAGNOSIS — R2689 Other abnormalities of gait and mobility: Secondary | ICD-10-CM | POA: Diagnosis not present

## 2023-07-16 DIAGNOSIS — F0390 Unspecified dementia without behavioral disturbance: Secondary | ICD-10-CM | POA: Diagnosis not present

## 2023-07-16 DIAGNOSIS — R4182 Altered mental status, unspecified: Secondary | ICD-10-CM | POA: Diagnosis not present

## 2023-07-17 DIAGNOSIS — F0390 Unspecified dementia without behavioral disturbance: Secondary | ICD-10-CM | POA: Diagnosis not present

## 2023-07-17 DIAGNOSIS — R4182 Altered mental status, unspecified: Secondary | ICD-10-CM | POA: Diagnosis not present

## 2023-07-17 DIAGNOSIS — R2689 Other abnormalities of gait and mobility: Secondary | ICD-10-CM | POA: Diagnosis not present

## 2023-07-17 DIAGNOSIS — R41841 Cognitive communication deficit: Secondary | ICD-10-CM | POA: Diagnosis not present

## 2023-07-18 DIAGNOSIS — R41841 Cognitive communication deficit: Secondary | ICD-10-CM | POA: Diagnosis not present

## 2023-07-18 DIAGNOSIS — R4182 Altered mental status, unspecified: Secondary | ICD-10-CM | POA: Diagnosis not present

## 2023-07-18 DIAGNOSIS — F0390 Unspecified dementia without behavioral disturbance: Secondary | ICD-10-CM | POA: Diagnosis not present

## 2023-07-18 DIAGNOSIS — R2689 Other abnormalities of gait and mobility: Secondary | ICD-10-CM | POA: Diagnosis not present

## 2023-07-19 DIAGNOSIS — R2689 Other abnormalities of gait and mobility: Secondary | ICD-10-CM | POA: Diagnosis not present

## 2023-07-19 DIAGNOSIS — F0390 Unspecified dementia without behavioral disturbance: Secondary | ICD-10-CM | POA: Diagnosis not present

## 2023-07-19 DIAGNOSIS — R41841 Cognitive communication deficit: Secondary | ICD-10-CM | POA: Diagnosis not present

## 2023-07-19 DIAGNOSIS — R4182 Altered mental status, unspecified: Secondary | ICD-10-CM | POA: Diagnosis not present

## 2023-07-20 DIAGNOSIS — R2689 Other abnormalities of gait and mobility: Secondary | ICD-10-CM | POA: Diagnosis not present

## 2023-07-20 DIAGNOSIS — F0390 Unspecified dementia without behavioral disturbance: Secondary | ICD-10-CM | POA: Diagnosis not present

## 2023-07-20 DIAGNOSIS — R41841 Cognitive communication deficit: Secondary | ICD-10-CM | POA: Diagnosis not present

## 2023-07-20 DIAGNOSIS — R4182 Altered mental status, unspecified: Secondary | ICD-10-CM | POA: Diagnosis not present

## 2023-07-23 DIAGNOSIS — R2689 Other abnormalities of gait and mobility: Secondary | ICD-10-CM | POA: Diagnosis not present

## 2023-07-23 DIAGNOSIS — R4182 Altered mental status, unspecified: Secondary | ICD-10-CM | POA: Diagnosis not present

## 2023-07-23 DIAGNOSIS — R41841 Cognitive communication deficit: Secondary | ICD-10-CM | POA: Diagnosis not present

## 2023-07-23 DIAGNOSIS — F0390 Unspecified dementia without behavioral disturbance: Secondary | ICD-10-CM | POA: Diagnosis not present

## 2023-07-24 DIAGNOSIS — R41841 Cognitive communication deficit: Secondary | ICD-10-CM | POA: Diagnosis not present

## 2023-07-24 DIAGNOSIS — R2689 Other abnormalities of gait and mobility: Secondary | ICD-10-CM | POA: Diagnosis not present

## 2023-07-24 DIAGNOSIS — R4182 Altered mental status, unspecified: Secondary | ICD-10-CM | POA: Diagnosis not present

## 2023-07-24 DIAGNOSIS — F0390 Unspecified dementia without behavioral disturbance: Secondary | ICD-10-CM | POA: Diagnosis not present

## 2023-07-25 DIAGNOSIS — F0390 Unspecified dementia without behavioral disturbance: Secondary | ICD-10-CM | POA: Diagnosis not present

## 2023-07-25 DIAGNOSIS — R4182 Altered mental status, unspecified: Secondary | ICD-10-CM | POA: Diagnosis not present

## 2023-07-25 DIAGNOSIS — R41841 Cognitive communication deficit: Secondary | ICD-10-CM | POA: Diagnosis not present

## 2023-07-25 DIAGNOSIS — R2689 Other abnormalities of gait and mobility: Secondary | ICD-10-CM | POA: Diagnosis not present

## 2023-07-26 DIAGNOSIS — R4182 Altered mental status, unspecified: Secondary | ICD-10-CM | POA: Diagnosis not present

## 2023-07-26 DIAGNOSIS — R2689 Other abnormalities of gait and mobility: Secondary | ICD-10-CM | POA: Diagnosis not present

## 2023-07-26 DIAGNOSIS — R41841 Cognitive communication deficit: Secondary | ICD-10-CM | POA: Diagnosis not present

## 2023-07-26 DIAGNOSIS — F0390 Unspecified dementia without behavioral disturbance: Secondary | ICD-10-CM | POA: Diagnosis not present

## 2023-07-27 DIAGNOSIS — R41841 Cognitive communication deficit: Secondary | ICD-10-CM | POA: Diagnosis not present

## 2023-07-27 DIAGNOSIS — F0390 Unspecified dementia without behavioral disturbance: Secondary | ICD-10-CM | POA: Diagnosis not present

## 2023-07-27 DIAGNOSIS — R2689 Other abnormalities of gait and mobility: Secondary | ICD-10-CM | POA: Diagnosis not present

## 2023-07-27 DIAGNOSIS — R4182 Altered mental status, unspecified: Secondary | ICD-10-CM | POA: Diagnosis not present

## 2023-07-30 DIAGNOSIS — R2689 Other abnormalities of gait and mobility: Secondary | ICD-10-CM | POA: Diagnosis not present

## 2023-07-30 DIAGNOSIS — F0390 Unspecified dementia without behavioral disturbance: Secondary | ICD-10-CM | POA: Diagnosis not present

## 2023-07-30 DIAGNOSIS — R41841 Cognitive communication deficit: Secondary | ICD-10-CM | POA: Diagnosis not present

## 2023-07-30 DIAGNOSIS — R4182 Altered mental status, unspecified: Secondary | ICD-10-CM | POA: Diagnosis not present

## 2023-07-31 DIAGNOSIS — R2689 Other abnormalities of gait and mobility: Secondary | ICD-10-CM | POA: Diagnosis not present

## 2023-07-31 DIAGNOSIS — F0390 Unspecified dementia without behavioral disturbance: Secondary | ICD-10-CM | POA: Diagnosis not present

## 2023-07-31 DIAGNOSIS — R41841 Cognitive communication deficit: Secondary | ICD-10-CM | POA: Diagnosis not present

## 2023-07-31 DIAGNOSIS — R4182 Altered mental status, unspecified: Secondary | ICD-10-CM | POA: Diagnosis not present

## 2023-08-01 DIAGNOSIS — F0390 Unspecified dementia without behavioral disturbance: Secondary | ICD-10-CM | POA: Diagnosis not present

## 2023-08-01 DIAGNOSIS — R4182 Altered mental status, unspecified: Secondary | ICD-10-CM | POA: Diagnosis not present

## 2023-08-01 DIAGNOSIS — R41841 Cognitive communication deficit: Secondary | ICD-10-CM | POA: Diagnosis not present

## 2023-08-01 DIAGNOSIS — R2689 Other abnormalities of gait and mobility: Secondary | ICD-10-CM | POA: Diagnosis not present

## 2023-08-02 DIAGNOSIS — Z23 Encounter for immunization: Secondary | ICD-10-CM | POA: Diagnosis not present

## 2023-08-10 DIAGNOSIS — F03C4 Unspecified dementia, severe, with anxiety: Secondary | ICD-10-CM | POA: Diagnosis not present

## 2023-08-10 DIAGNOSIS — R1310 Dysphagia, unspecified: Secondary | ICD-10-CM | POA: Diagnosis not present

## 2023-08-10 DIAGNOSIS — F03C3 Unspecified dementia, severe, with mood disturbance: Secondary | ICD-10-CM | POA: Diagnosis not present

## 2023-08-10 DIAGNOSIS — R627 Adult failure to thrive: Secondary | ICD-10-CM | POA: Diagnosis not present

## 2023-08-10 DIAGNOSIS — E1159 Type 2 diabetes mellitus with other circulatory complications: Secondary | ICD-10-CM | POA: Diagnosis not present

## 2023-08-10 DIAGNOSIS — I152 Hypertension secondary to endocrine disorders: Secondary | ICD-10-CM | POA: Diagnosis not present

## 2023-08-10 DIAGNOSIS — F39 Unspecified mood [affective] disorder: Secondary | ICD-10-CM | POA: Diagnosis not present

## 2023-08-10 DIAGNOSIS — Z681 Body mass index (BMI) 19 or less, adult: Secondary | ICD-10-CM | POA: Diagnosis not present

## 2023-08-10 DIAGNOSIS — E569 Vitamin deficiency, unspecified: Secondary | ICD-10-CM | POA: Diagnosis not present

## 2023-08-31 DIAGNOSIS — Z9189 Other specified personal risk factors, not elsewhere classified: Secondary | ICD-10-CM | POA: Diagnosis not present

## 2023-08-31 DIAGNOSIS — F0393 Unspecified dementia, unspecified severity, with mood disturbance: Secondary | ICD-10-CM | POA: Diagnosis not present

## 2023-09-25 DIAGNOSIS — F331 Major depressive disorder, recurrent, moderate: Secondary | ICD-10-CM | POA: Diagnosis not present

## 2023-10-01 DIAGNOSIS — E1159 Type 2 diabetes mellitus with other circulatory complications: Secondary | ICD-10-CM | POA: Diagnosis not present

## 2023-10-01 DIAGNOSIS — F0393 Unspecified dementia, unspecified severity, with mood disturbance: Secondary | ICD-10-CM | POA: Diagnosis not present

## 2023-10-01 DIAGNOSIS — E785 Hyperlipidemia, unspecified: Secondary | ICD-10-CM | POA: Diagnosis not present

## 2023-10-01 DIAGNOSIS — F0394 Unspecified dementia, unspecified severity, with anxiety: Secondary | ICD-10-CM | POA: Diagnosis not present

## 2023-10-01 DIAGNOSIS — I152 Hypertension secondary to endocrine disorders: Secondary | ICD-10-CM | POA: Diagnosis not present

## 2023-10-01 DIAGNOSIS — F39 Unspecified mood [affective] disorder: Secondary | ICD-10-CM | POA: Diagnosis not present

## 2023-10-02 DIAGNOSIS — F0390 Unspecified dementia without behavioral disturbance: Secondary | ICD-10-CM | POA: Diagnosis not present

## 2023-10-02 DIAGNOSIS — R2689 Other abnormalities of gait and mobility: Secondary | ICD-10-CM | POA: Diagnosis not present

## 2023-10-03 DIAGNOSIS — R2689 Other abnormalities of gait and mobility: Secondary | ICD-10-CM | POA: Diagnosis not present

## 2023-10-03 DIAGNOSIS — F0390 Unspecified dementia without behavioral disturbance: Secondary | ICD-10-CM | POA: Diagnosis not present

## 2023-10-04 DIAGNOSIS — F0390 Unspecified dementia without behavioral disturbance: Secondary | ICD-10-CM | POA: Diagnosis not present

## 2023-10-04 DIAGNOSIS — R2689 Other abnormalities of gait and mobility: Secondary | ICD-10-CM | POA: Diagnosis not present

## 2023-10-05 DIAGNOSIS — R2689 Other abnormalities of gait and mobility: Secondary | ICD-10-CM | POA: Diagnosis not present

## 2023-10-05 DIAGNOSIS — F0390 Unspecified dementia without behavioral disturbance: Secondary | ICD-10-CM | POA: Diagnosis not present

## 2023-10-06 DIAGNOSIS — R2689 Other abnormalities of gait and mobility: Secondary | ICD-10-CM | POA: Diagnosis not present

## 2023-10-06 DIAGNOSIS — F0390 Unspecified dementia without behavioral disturbance: Secondary | ICD-10-CM | POA: Diagnosis not present

## 2023-10-08 DIAGNOSIS — F0390 Unspecified dementia without behavioral disturbance: Secondary | ICD-10-CM | POA: Diagnosis not present

## 2023-10-08 DIAGNOSIS — R2689 Other abnormalities of gait and mobility: Secondary | ICD-10-CM | POA: Diagnosis not present

## 2023-10-09 DIAGNOSIS — F0390 Unspecified dementia without behavioral disturbance: Secondary | ICD-10-CM | POA: Diagnosis not present

## 2023-10-09 DIAGNOSIS — R2689 Other abnormalities of gait and mobility: Secondary | ICD-10-CM | POA: Diagnosis not present

## 2023-10-10 DIAGNOSIS — R2689 Other abnormalities of gait and mobility: Secondary | ICD-10-CM | POA: Diagnosis not present

## 2023-10-10 DIAGNOSIS — F0390 Unspecified dementia without behavioral disturbance: Secondary | ICD-10-CM | POA: Diagnosis not present

## 2023-10-11 DIAGNOSIS — F0390 Unspecified dementia without behavioral disturbance: Secondary | ICD-10-CM | POA: Diagnosis not present

## 2023-10-11 DIAGNOSIS — R2689 Other abnormalities of gait and mobility: Secondary | ICD-10-CM | POA: Diagnosis not present

## 2023-10-12 DIAGNOSIS — D518 Other vitamin B12 deficiency anemias: Secondary | ICD-10-CM | POA: Diagnosis not present

## 2023-10-12 DIAGNOSIS — E08311 Diabetes mellitus due to underlying condition with unspecified diabetic retinopathy with macular edema: Secondary | ICD-10-CM | POA: Diagnosis not present

## 2023-10-12 DIAGNOSIS — E119 Type 2 diabetes mellitus without complications: Secondary | ICD-10-CM | POA: Diagnosis not present

## 2023-10-12 DIAGNOSIS — I10 Essential (primary) hypertension: Secondary | ICD-10-CM | POA: Diagnosis not present

## 2023-10-12 DIAGNOSIS — F0390 Unspecified dementia without behavioral disturbance: Secondary | ICD-10-CM | POA: Diagnosis not present

## 2023-10-12 DIAGNOSIS — E785 Hyperlipidemia, unspecified: Secondary | ICD-10-CM | POA: Diagnosis not present

## 2023-10-12 DIAGNOSIS — E559 Vitamin D deficiency, unspecified: Secondary | ICD-10-CM | POA: Diagnosis not present

## 2023-10-12 DIAGNOSIS — R2689 Other abnormalities of gait and mobility: Secondary | ICD-10-CM | POA: Diagnosis not present

## 2023-10-15 DIAGNOSIS — R2689 Other abnormalities of gait and mobility: Secondary | ICD-10-CM | POA: Diagnosis not present

## 2023-10-15 DIAGNOSIS — F0390 Unspecified dementia without behavioral disturbance: Secondary | ICD-10-CM | POA: Diagnosis not present

## 2023-10-16 DIAGNOSIS — F331 Major depressive disorder, recurrent, moderate: Secondary | ICD-10-CM | POA: Diagnosis not present

## 2023-10-16 DIAGNOSIS — R2689 Other abnormalities of gait and mobility: Secondary | ICD-10-CM | POA: Diagnosis not present

## 2023-10-16 DIAGNOSIS — F0390 Unspecified dementia without behavioral disturbance: Secondary | ICD-10-CM | POA: Diagnosis not present

## 2023-10-17 DIAGNOSIS — F0390 Unspecified dementia without behavioral disturbance: Secondary | ICD-10-CM | POA: Diagnosis not present

## 2023-10-17 DIAGNOSIS — R2689 Other abnormalities of gait and mobility: Secondary | ICD-10-CM | POA: Diagnosis not present

## 2023-10-18 DIAGNOSIS — F0390 Unspecified dementia without behavioral disturbance: Secondary | ICD-10-CM | POA: Diagnosis not present

## 2023-10-18 DIAGNOSIS — R2689 Other abnormalities of gait and mobility: Secondary | ICD-10-CM | POA: Diagnosis not present

## 2023-10-19 DIAGNOSIS — F0390 Unspecified dementia without behavioral disturbance: Secondary | ICD-10-CM | POA: Diagnosis not present

## 2023-10-19 DIAGNOSIS — R2689 Other abnormalities of gait and mobility: Secondary | ICD-10-CM | POA: Diagnosis not present

## 2023-10-22 DIAGNOSIS — R2689 Other abnormalities of gait and mobility: Secondary | ICD-10-CM | POA: Diagnosis not present

## 2023-10-22 DIAGNOSIS — F0390 Unspecified dementia without behavioral disturbance: Secondary | ICD-10-CM | POA: Diagnosis not present

## 2023-10-23 DIAGNOSIS — R2689 Other abnormalities of gait and mobility: Secondary | ICD-10-CM | POA: Diagnosis not present

## 2023-10-23 DIAGNOSIS — F0390 Unspecified dementia without behavioral disturbance: Secondary | ICD-10-CM | POA: Diagnosis not present

## 2023-10-25 DIAGNOSIS — R2689 Other abnormalities of gait and mobility: Secondary | ICD-10-CM | POA: Diagnosis not present

## 2023-10-25 DIAGNOSIS — F0390 Unspecified dementia without behavioral disturbance: Secondary | ICD-10-CM | POA: Diagnosis not present

## 2023-10-26 DIAGNOSIS — R2689 Other abnormalities of gait and mobility: Secondary | ICD-10-CM | POA: Diagnosis not present

## 2023-10-26 DIAGNOSIS — F0390 Unspecified dementia without behavioral disturbance: Secondary | ICD-10-CM | POA: Diagnosis not present

## 2023-10-27 DIAGNOSIS — F0390 Unspecified dementia without behavioral disturbance: Secondary | ICD-10-CM | POA: Diagnosis not present

## 2023-10-27 DIAGNOSIS — R2689 Other abnormalities of gait and mobility: Secondary | ICD-10-CM | POA: Diagnosis not present

## 2023-10-29 DIAGNOSIS — R2689 Other abnormalities of gait and mobility: Secondary | ICD-10-CM | POA: Diagnosis not present

## 2023-10-29 DIAGNOSIS — F0390 Unspecified dementia without behavioral disturbance: Secondary | ICD-10-CM | POA: Diagnosis not present

## 2023-10-30 DIAGNOSIS — R2689 Other abnormalities of gait and mobility: Secondary | ICD-10-CM | POA: Diagnosis not present

## 2023-10-30 DIAGNOSIS — F0390 Unspecified dementia without behavioral disturbance: Secondary | ICD-10-CM | POA: Diagnosis not present

## 2023-10-31 DIAGNOSIS — E119 Type 2 diabetes mellitus without complications: Secondary | ICD-10-CM | POA: Diagnosis not present

## 2023-10-31 DIAGNOSIS — M6281 Muscle weakness (generalized): Secondary | ICD-10-CM | POA: Diagnosis not present

## 2023-10-31 DIAGNOSIS — R2689 Other abnormalities of gait and mobility: Secondary | ICD-10-CM | POA: Diagnosis not present

## 2023-10-31 DIAGNOSIS — F0390 Unspecified dementia without behavioral disturbance: Secondary | ICD-10-CM | POA: Diagnosis not present

## 2023-10-31 DIAGNOSIS — R2681 Unsteadiness on feet: Secondary | ICD-10-CM | POA: Diagnosis not present

## 2023-11-01 DIAGNOSIS — R2689 Other abnormalities of gait and mobility: Secondary | ICD-10-CM | POA: Diagnosis not present

## 2023-11-01 DIAGNOSIS — R2681 Unsteadiness on feet: Secondary | ICD-10-CM | POA: Diagnosis not present

## 2023-11-01 DIAGNOSIS — F0390 Unspecified dementia without behavioral disturbance: Secondary | ICD-10-CM | POA: Diagnosis not present

## 2023-11-01 DIAGNOSIS — E119 Type 2 diabetes mellitus without complications: Secondary | ICD-10-CM | POA: Diagnosis not present

## 2023-11-01 DIAGNOSIS — M6281 Muscle weakness (generalized): Secondary | ICD-10-CM | POA: Diagnosis not present

## 2023-11-12 DIAGNOSIS — R2689 Other abnormalities of gait and mobility: Secondary | ICD-10-CM | POA: Diagnosis not present

## 2023-11-12 DIAGNOSIS — M6281 Muscle weakness (generalized): Secondary | ICD-10-CM | POA: Diagnosis not present

## 2023-11-12 DIAGNOSIS — F0390 Unspecified dementia without behavioral disturbance: Secondary | ICD-10-CM | POA: Diagnosis not present

## 2023-11-12 DIAGNOSIS — E119 Type 2 diabetes mellitus without complications: Secondary | ICD-10-CM | POA: Diagnosis not present

## 2023-11-12 DIAGNOSIS — R2681 Unsteadiness on feet: Secondary | ICD-10-CM | POA: Diagnosis not present

## 2023-11-13 DIAGNOSIS — R2681 Unsteadiness on feet: Secondary | ICD-10-CM | POA: Diagnosis not present

## 2023-11-13 DIAGNOSIS — M6281 Muscle weakness (generalized): Secondary | ICD-10-CM | POA: Diagnosis not present

## 2023-11-13 DIAGNOSIS — R2689 Other abnormalities of gait and mobility: Secondary | ICD-10-CM | POA: Diagnosis not present

## 2023-11-13 DIAGNOSIS — F0390 Unspecified dementia without behavioral disturbance: Secondary | ICD-10-CM | POA: Diagnosis not present

## 2023-11-13 DIAGNOSIS — E119 Type 2 diabetes mellitus without complications: Secondary | ICD-10-CM | POA: Diagnosis not present

## 2023-11-14 DIAGNOSIS — R2681 Unsteadiness on feet: Secondary | ICD-10-CM | POA: Diagnosis not present

## 2023-11-14 DIAGNOSIS — E119 Type 2 diabetes mellitus without complications: Secondary | ICD-10-CM | POA: Diagnosis not present

## 2023-11-14 DIAGNOSIS — F0390 Unspecified dementia without behavioral disturbance: Secondary | ICD-10-CM | POA: Diagnosis not present

## 2023-11-14 DIAGNOSIS — M6281 Muscle weakness (generalized): Secondary | ICD-10-CM | POA: Diagnosis not present

## 2023-11-14 DIAGNOSIS — R2689 Other abnormalities of gait and mobility: Secondary | ICD-10-CM | POA: Diagnosis not present

## 2023-11-15 DIAGNOSIS — E119 Type 2 diabetes mellitus without complications: Secondary | ICD-10-CM | POA: Diagnosis not present

## 2023-11-15 DIAGNOSIS — R2689 Other abnormalities of gait and mobility: Secondary | ICD-10-CM | POA: Diagnosis not present

## 2023-11-15 DIAGNOSIS — F0390 Unspecified dementia without behavioral disturbance: Secondary | ICD-10-CM | POA: Diagnosis not present

## 2023-11-15 DIAGNOSIS — R2681 Unsteadiness on feet: Secondary | ICD-10-CM | POA: Diagnosis not present

## 2023-11-15 DIAGNOSIS — M6281 Muscle weakness (generalized): Secondary | ICD-10-CM | POA: Diagnosis not present

## 2023-11-16 DIAGNOSIS — R2681 Unsteadiness on feet: Secondary | ICD-10-CM | POA: Diagnosis not present

## 2023-11-16 DIAGNOSIS — E119 Type 2 diabetes mellitus without complications: Secondary | ICD-10-CM | POA: Diagnosis not present

## 2023-11-16 DIAGNOSIS — F0390 Unspecified dementia without behavioral disturbance: Secondary | ICD-10-CM | POA: Diagnosis not present

## 2023-11-16 DIAGNOSIS — R2689 Other abnormalities of gait and mobility: Secondary | ICD-10-CM | POA: Diagnosis not present

## 2023-11-16 DIAGNOSIS — M6281 Muscle weakness (generalized): Secondary | ICD-10-CM | POA: Diagnosis not present

## 2023-11-19 DIAGNOSIS — R2689 Other abnormalities of gait and mobility: Secondary | ICD-10-CM | POA: Diagnosis not present

## 2023-11-19 DIAGNOSIS — F0390 Unspecified dementia without behavioral disturbance: Secondary | ICD-10-CM | POA: Diagnosis not present

## 2023-11-19 DIAGNOSIS — R2681 Unsteadiness on feet: Secondary | ICD-10-CM | POA: Diagnosis not present

## 2023-11-19 DIAGNOSIS — E119 Type 2 diabetes mellitus without complications: Secondary | ICD-10-CM | POA: Diagnosis not present

## 2023-11-19 DIAGNOSIS — M6281 Muscle weakness (generalized): Secondary | ICD-10-CM | POA: Diagnosis not present

## 2023-11-20 DIAGNOSIS — F0390 Unspecified dementia without behavioral disturbance: Secondary | ICD-10-CM | POA: Diagnosis not present

## 2023-11-20 DIAGNOSIS — R2681 Unsteadiness on feet: Secondary | ICD-10-CM | POA: Diagnosis not present

## 2023-11-20 DIAGNOSIS — R2689 Other abnormalities of gait and mobility: Secondary | ICD-10-CM | POA: Diagnosis not present

## 2023-11-20 DIAGNOSIS — M6281 Muscle weakness (generalized): Secondary | ICD-10-CM | POA: Diagnosis not present

## 2023-11-20 DIAGNOSIS — E119 Type 2 diabetes mellitus without complications: Secondary | ICD-10-CM | POA: Diagnosis not present

## 2023-11-21 DIAGNOSIS — R2689 Other abnormalities of gait and mobility: Secondary | ICD-10-CM | POA: Diagnosis not present

## 2023-11-21 DIAGNOSIS — R2681 Unsteadiness on feet: Secondary | ICD-10-CM | POA: Diagnosis not present

## 2023-11-21 DIAGNOSIS — E119 Type 2 diabetes mellitus without complications: Secondary | ICD-10-CM | POA: Diagnosis not present

## 2023-11-21 DIAGNOSIS — F0390 Unspecified dementia without behavioral disturbance: Secondary | ICD-10-CM | POA: Diagnosis not present

## 2023-11-21 DIAGNOSIS — M6281 Muscle weakness (generalized): Secondary | ICD-10-CM | POA: Diagnosis not present

## 2023-11-22 DIAGNOSIS — M6281 Muscle weakness (generalized): Secondary | ICD-10-CM | POA: Diagnosis not present

## 2023-11-22 DIAGNOSIS — R2681 Unsteadiness on feet: Secondary | ICD-10-CM | POA: Diagnosis not present

## 2023-11-22 DIAGNOSIS — R2689 Other abnormalities of gait and mobility: Secondary | ICD-10-CM | POA: Diagnosis not present

## 2023-11-22 DIAGNOSIS — E119 Type 2 diabetes mellitus without complications: Secondary | ICD-10-CM | POA: Diagnosis not present

## 2023-11-22 DIAGNOSIS — F0390 Unspecified dementia without behavioral disturbance: Secondary | ICD-10-CM | POA: Diagnosis not present

## 2023-11-23 DIAGNOSIS — R2689 Other abnormalities of gait and mobility: Secondary | ICD-10-CM | POA: Diagnosis not present

## 2023-11-23 DIAGNOSIS — E119 Type 2 diabetes mellitus without complications: Secondary | ICD-10-CM | POA: Diagnosis not present

## 2023-11-23 DIAGNOSIS — R2681 Unsteadiness on feet: Secondary | ICD-10-CM | POA: Diagnosis not present

## 2023-11-23 DIAGNOSIS — F0390 Unspecified dementia without behavioral disturbance: Secondary | ICD-10-CM | POA: Diagnosis not present

## 2023-11-23 DIAGNOSIS — M6281 Muscle weakness (generalized): Secondary | ICD-10-CM | POA: Diagnosis not present

## 2023-11-26 DIAGNOSIS — F0390 Unspecified dementia without behavioral disturbance: Secondary | ICD-10-CM | POA: Diagnosis not present

## 2023-11-26 DIAGNOSIS — R2681 Unsteadiness on feet: Secondary | ICD-10-CM | POA: Diagnosis not present

## 2023-11-26 DIAGNOSIS — R2689 Other abnormalities of gait and mobility: Secondary | ICD-10-CM | POA: Diagnosis not present

## 2023-11-26 DIAGNOSIS — M6281 Muscle weakness (generalized): Secondary | ICD-10-CM | POA: Diagnosis not present

## 2023-11-26 DIAGNOSIS — E119 Type 2 diabetes mellitus without complications: Secondary | ICD-10-CM | POA: Diagnosis not present

## 2023-11-27 DIAGNOSIS — R2689 Other abnormalities of gait and mobility: Secondary | ICD-10-CM | POA: Diagnosis not present

## 2023-11-27 DIAGNOSIS — F0390 Unspecified dementia without behavioral disturbance: Secondary | ICD-10-CM | POA: Diagnosis not present

## 2023-11-27 DIAGNOSIS — F331 Major depressive disorder, recurrent, moderate: Secondary | ICD-10-CM | POA: Diagnosis not present

## 2023-11-27 DIAGNOSIS — E119 Type 2 diabetes mellitus without complications: Secondary | ICD-10-CM | POA: Diagnosis not present

## 2023-11-27 DIAGNOSIS — M6281 Muscle weakness (generalized): Secondary | ICD-10-CM | POA: Diagnosis not present

## 2023-11-27 DIAGNOSIS — R2681 Unsteadiness on feet: Secondary | ICD-10-CM | POA: Diagnosis not present

## 2023-11-28 DIAGNOSIS — M6281 Muscle weakness (generalized): Secondary | ICD-10-CM | POA: Diagnosis not present

## 2023-11-28 DIAGNOSIS — R2689 Other abnormalities of gait and mobility: Secondary | ICD-10-CM | POA: Diagnosis not present

## 2023-11-28 DIAGNOSIS — E119 Type 2 diabetes mellitus without complications: Secondary | ICD-10-CM | POA: Diagnosis not present

## 2023-11-28 DIAGNOSIS — F0390 Unspecified dementia without behavioral disturbance: Secondary | ICD-10-CM | POA: Diagnosis not present

## 2023-11-28 DIAGNOSIS — R2681 Unsteadiness on feet: Secondary | ICD-10-CM | POA: Diagnosis not present

## 2023-11-29 DIAGNOSIS — R2689 Other abnormalities of gait and mobility: Secondary | ICD-10-CM | POA: Diagnosis not present

## 2023-11-29 DIAGNOSIS — M6281 Muscle weakness (generalized): Secondary | ICD-10-CM | POA: Diagnosis not present

## 2023-11-29 DIAGNOSIS — F0390 Unspecified dementia without behavioral disturbance: Secondary | ICD-10-CM | POA: Diagnosis not present

## 2023-11-29 DIAGNOSIS — E119 Type 2 diabetes mellitus without complications: Secondary | ICD-10-CM | POA: Diagnosis not present

## 2023-11-29 DIAGNOSIS — R2681 Unsteadiness on feet: Secondary | ICD-10-CM | POA: Diagnosis not present

## 2023-12-06 DIAGNOSIS — Z0189 Encounter for other specified special examinations: Secondary | ICD-10-CM | POA: Diagnosis not present

## 2023-12-06 DIAGNOSIS — R1310 Dysphagia, unspecified: Secondary | ICD-10-CM | POA: Diagnosis not present

## 2023-12-06 DIAGNOSIS — R5381 Other malaise: Secondary | ICD-10-CM | POA: Diagnosis not present

## 2023-12-06 DIAGNOSIS — E1159 Type 2 diabetes mellitus with other circulatory complications: Secondary | ICD-10-CM | POA: Diagnosis not present

## 2023-12-06 DIAGNOSIS — F03C4 Unspecified dementia, severe, with anxiety: Secondary | ICD-10-CM | POA: Diagnosis not present

## 2023-12-06 DIAGNOSIS — F39 Unspecified mood [affective] disorder: Secondary | ICD-10-CM | POA: Diagnosis not present

## 2023-12-06 DIAGNOSIS — I152 Hypertension secondary to endocrine disorders: Secondary | ICD-10-CM | POA: Diagnosis not present

## 2023-12-06 DIAGNOSIS — F03C3 Unspecified dementia, severe, with mood disturbance: Secondary | ICD-10-CM | POA: Diagnosis not present

## 2023-12-06 DIAGNOSIS — L309 Dermatitis, unspecified: Secondary | ICD-10-CM | POA: Diagnosis not present

## 2024-01-01 DIAGNOSIS — F331 Major depressive disorder, recurrent, moderate: Secondary | ICD-10-CM | POA: Diagnosis not present

## 2024-02-05 DIAGNOSIS — F0390 Unspecified dementia without behavioral disturbance: Secondary | ICD-10-CM | POA: Diagnosis not present

## 2024-02-05 DIAGNOSIS — M6281 Muscle weakness (generalized): Secondary | ICD-10-CM | POA: Diagnosis not present

## 2024-02-06 DIAGNOSIS — F0390 Unspecified dementia without behavioral disturbance: Secondary | ICD-10-CM | POA: Diagnosis not present

## 2024-02-06 DIAGNOSIS — M6281 Muscle weakness (generalized): Secondary | ICD-10-CM | POA: Diagnosis not present

## 2024-02-07 DIAGNOSIS — M6281 Muscle weakness (generalized): Secondary | ICD-10-CM | POA: Diagnosis not present

## 2024-02-07 DIAGNOSIS — F0390 Unspecified dementia without behavioral disturbance: Secondary | ICD-10-CM | POA: Diagnosis not present

## 2024-02-08 DIAGNOSIS — M6281 Muscle weakness (generalized): Secondary | ICD-10-CM | POA: Diagnosis not present

## 2024-02-08 DIAGNOSIS — F0390 Unspecified dementia without behavioral disturbance: Secondary | ICD-10-CM | POA: Diagnosis not present

## 2024-02-10 DIAGNOSIS — M6281 Muscle weakness (generalized): Secondary | ICD-10-CM | POA: Diagnosis not present

## 2024-02-10 DIAGNOSIS — F0390 Unspecified dementia without behavioral disturbance: Secondary | ICD-10-CM | POA: Diagnosis not present

## 2024-02-11 DIAGNOSIS — M6281 Muscle weakness (generalized): Secondary | ICD-10-CM | POA: Diagnosis not present

## 2024-02-11 DIAGNOSIS — F0390 Unspecified dementia without behavioral disturbance: Secondary | ICD-10-CM | POA: Diagnosis not present

## 2024-02-12 DIAGNOSIS — F0390 Unspecified dementia without behavioral disturbance: Secondary | ICD-10-CM | POA: Diagnosis not present

## 2024-02-12 DIAGNOSIS — M6281 Muscle weakness (generalized): Secondary | ICD-10-CM | POA: Diagnosis not present

## 2024-02-13 DIAGNOSIS — F0390 Unspecified dementia without behavioral disturbance: Secondary | ICD-10-CM | POA: Diagnosis not present

## 2024-02-13 DIAGNOSIS — M6281 Muscle weakness (generalized): Secondary | ICD-10-CM | POA: Diagnosis not present

## 2024-02-14 DIAGNOSIS — F0390 Unspecified dementia without behavioral disturbance: Secondary | ICD-10-CM | POA: Diagnosis not present

## 2024-02-14 DIAGNOSIS — M6281 Muscle weakness (generalized): Secondary | ICD-10-CM | POA: Diagnosis not present

## 2024-02-18 DIAGNOSIS — F0390 Unspecified dementia without behavioral disturbance: Secondary | ICD-10-CM | POA: Diagnosis not present

## 2024-02-18 DIAGNOSIS — M6281 Muscle weakness (generalized): Secondary | ICD-10-CM | POA: Diagnosis not present

## 2024-02-19 DIAGNOSIS — F0390 Unspecified dementia without behavioral disturbance: Secondary | ICD-10-CM | POA: Diagnosis not present

## 2024-02-19 DIAGNOSIS — M6281 Muscle weakness (generalized): Secondary | ICD-10-CM | POA: Diagnosis not present

## 2024-02-21 DIAGNOSIS — F0390 Unspecified dementia without behavioral disturbance: Secondary | ICD-10-CM | POA: Diagnosis not present

## 2024-02-21 DIAGNOSIS — M6281 Muscle weakness (generalized): Secondary | ICD-10-CM | POA: Diagnosis not present

## 2024-02-22 DIAGNOSIS — M6281 Muscle weakness (generalized): Secondary | ICD-10-CM | POA: Diagnosis not present

## 2024-02-22 DIAGNOSIS — F0390 Unspecified dementia without behavioral disturbance: Secondary | ICD-10-CM | POA: Diagnosis not present

## 2024-02-23 DIAGNOSIS — M6281 Muscle weakness (generalized): Secondary | ICD-10-CM | POA: Diagnosis not present

## 2024-02-23 DIAGNOSIS — F0390 Unspecified dementia without behavioral disturbance: Secondary | ICD-10-CM | POA: Diagnosis not present

## 2024-02-25 DIAGNOSIS — M6281 Muscle weakness (generalized): Secondary | ICD-10-CM | POA: Diagnosis not present

## 2024-02-25 DIAGNOSIS — F0390 Unspecified dementia without behavioral disturbance: Secondary | ICD-10-CM | POA: Diagnosis not present

## 2024-02-26 DIAGNOSIS — F0390 Unspecified dementia without behavioral disturbance: Secondary | ICD-10-CM | POA: Diagnosis not present

## 2024-02-26 DIAGNOSIS — M6281 Muscle weakness (generalized): Secondary | ICD-10-CM | POA: Diagnosis not present

## 2024-02-27 DIAGNOSIS — F0390 Unspecified dementia without behavioral disturbance: Secondary | ICD-10-CM | POA: Diagnosis not present

## 2024-02-27 DIAGNOSIS — M6281 Muscle weakness (generalized): Secondary | ICD-10-CM | POA: Diagnosis not present

## 2024-05-06 DIAGNOSIS — R41841 Cognitive communication deficit: Secondary | ICD-10-CM | POA: Diagnosis not present

## 2024-05-06 DIAGNOSIS — R2689 Other abnormalities of gait and mobility: Secondary | ICD-10-CM | POA: Diagnosis not present

## 2024-05-06 DIAGNOSIS — R1310 Dysphagia, unspecified: Secondary | ICD-10-CM | POA: Diagnosis not present

## 2024-05-06 DIAGNOSIS — F0390 Unspecified dementia without behavioral disturbance: Secondary | ICD-10-CM | POA: Diagnosis not present

## 2024-05-07 DIAGNOSIS — R41841 Cognitive communication deficit: Secondary | ICD-10-CM | POA: Diagnosis not present

## 2024-05-07 DIAGNOSIS — R2689 Other abnormalities of gait and mobility: Secondary | ICD-10-CM | POA: Diagnosis not present

## 2024-05-07 DIAGNOSIS — R1310 Dysphagia, unspecified: Secondary | ICD-10-CM | POA: Diagnosis not present

## 2024-05-07 DIAGNOSIS — F0390 Unspecified dementia without behavioral disturbance: Secondary | ICD-10-CM | POA: Diagnosis not present

## 2024-05-08 DIAGNOSIS — R2689 Other abnormalities of gait and mobility: Secondary | ICD-10-CM | POA: Diagnosis not present

## 2024-05-08 DIAGNOSIS — R41841 Cognitive communication deficit: Secondary | ICD-10-CM | POA: Diagnosis not present

## 2024-05-08 DIAGNOSIS — F0390 Unspecified dementia without behavioral disturbance: Secondary | ICD-10-CM | POA: Diagnosis not present

## 2024-05-08 DIAGNOSIS — R1310 Dysphagia, unspecified: Secondary | ICD-10-CM | POA: Diagnosis not present

## 2024-05-09 DIAGNOSIS — F0390 Unspecified dementia without behavioral disturbance: Secondary | ICD-10-CM | POA: Diagnosis not present

## 2024-05-09 DIAGNOSIS — R2689 Other abnormalities of gait and mobility: Secondary | ICD-10-CM | POA: Diagnosis not present

## 2024-05-09 DIAGNOSIS — R1310 Dysphagia, unspecified: Secondary | ICD-10-CM | POA: Diagnosis not present

## 2024-05-09 DIAGNOSIS — R41841 Cognitive communication deficit: Secondary | ICD-10-CM | POA: Diagnosis not present

## 2024-05-12 DIAGNOSIS — R41841 Cognitive communication deficit: Secondary | ICD-10-CM | POA: Diagnosis not present

## 2024-05-12 DIAGNOSIS — F0390 Unspecified dementia without behavioral disturbance: Secondary | ICD-10-CM | POA: Diagnosis not present

## 2024-05-12 DIAGNOSIS — R2689 Other abnormalities of gait and mobility: Secondary | ICD-10-CM | POA: Diagnosis not present

## 2024-05-12 DIAGNOSIS — R1310 Dysphagia, unspecified: Secondary | ICD-10-CM | POA: Diagnosis not present

## 2024-05-13 DIAGNOSIS — R41841 Cognitive communication deficit: Secondary | ICD-10-CM | POA: Diagnosis not present

## 2024-05-13 DIAGNOSIS — R2689 Other abnormalities of gait and mobility: Secondary | ICD-10-CM | POA: Diagnosis not present

## 2024-05-13 DIAGNOSIS — R1310 Dysphagia, unspecified: Secondary | ICD-10-CM | POA: Diagnosis not present

## 2024-05-13 DIAGNOSIS — F0390 Unspecified dementia without behavioral disturbance: Secondary | ICD-10-CM | POA: Diagnosis not present

## 2024-05-14 DIAGNOSIS — F0390 Unspecified dementia without behavioral disturbance: Secondary | ICD-10-CM | POA: Diagnosis not present

## 2024-05-14 DIAGNOSIS — R1310 Dysphagia, unspecified: Secondary | ICD-10-CM | POA: Diagnosis not present

## 2024-05-14 DIAGNOSIS — R2689 Other abnormalities of gait and mobility: Secondary | ICD-10-CM | POA: Diagnosis not present

## 2024-05-14 DIAGNOSIS — R41841 Cognitive communication deficit: Secondary | ICD-10-CM | POA: Diagnosis not present

## 2024-05-15 DIAGNOSIS — R2689 Other abnormalities of gait and mobility: Secondary | ICD-10-CM | POA: Diagnosis not present

## 2024-05-15 DIAGNOSIS — R41841 Cognitive communication deficit: Secondary | ICD-10-CM | POA: Diagnosis not present

## 2024-05-15 DIAGNOSIS — R1310 Dysphagia, unspecified: Secondary | ICD-10-CM | POA: Diagnosis not present

## 2024-05-15 DIAGNOSIS — F0390 Unspecified dementia without behavioral disturbance: Secondary | ICD-10-CM | POA: Diagnosis not present

## 2024-05-16 DIAGNOSIS — F0390 Unspecified dementia without behavioral disturbance: Secondary | ICD-10-CM | POA: Diagnosis not present

## 2024-05-16 DIAGNOSIS — R1310 Dysphagia, unspecified: Secondary | ICD-10-CM | POA: Diagnosis not present

## 2024-05-16 DIAGNOSIS — R41841 Cognitive communication deficit: Secondary | ICD-10-CM | POA: Diagnosis not present

## 2024-05-16 DIAGNOSIS — R2689 Other abnormalities of gait and mobility: Secondary | ICD-10-CM | POA: Diagnosis not present

## 2024-05-19 DIAGNOSIS — R2689 Other abnormalities of gait and mobility: Secondary | ICD-10-CM | POA: Diagnosis not present

## 2024-05-19 DIAGNOSIS — R41841 Cognitive communication deficit: Secondary | ICD-10-CM | POA: Diagnosis not present

## 2024-05-19 DIAGNOSIS — R1310 Dysphagia, unspecified: Secondary | ICD-10-CM | POA: Diagnosis not present

## 2024-05-19 DIAGNOSIS — F0390 Unspecified dementia without behavioral disturbance: Secondary | ICD-10-CM | POA: Diagnosis not present

## 2024-05-20 DIAGNOSIS — F0390 Unspecified dementia without behavioral disturbance: Secondary | ICD-10-CM | POA: Diagnosis not present

## 2024-05-20 DIAGNOSIS — R1310 Dysphagia, unspecified: Secondary | ICD-10-CM | POA: Diagnosis not present

## 2024-05-20 DIAGNOSIS — R41841 Cognitive communication deficit: Secondary | ICD-10-CM | POA: Diagnosis not present

## 2024-05-20 DIAGNOSIS — R2689 Other abnormalities of gait and mobility: Secondary | ICD-10-CM | POA: Diagnosis not present

## 2024-05-21 DIAGNOSIS — R2689 Other abnormalities of gait and mobility: Secondary | ICD-10-CM | POA: Diagnosis not present

## 2024-05-21 DIAGNOSIS — F0390 Unspecified dementia without behavioral disturbance: Secondary | ICD-10-CM | POA: Diagnosis not present

## 2024-05-21 DIAGNOSIS — R1310 Dysphagia, unspecified: Secondary | ICD-10-CM | POA: Diagnosis not present

## 2024-05-21 DIAGNOSIS — R41841 Cognitive communication deficit: Secondary | ICD-10-CM | POA: Diagnosis not present

## 2024-05-22 DIAGNOSIS — F0390 Unspecified dementia without behavioral disturbance: Secondary | ICD-10-CM | POA: Diagnosis not present

## 2024-05-22 DIAGNOSIS — R2689 Other abnormalities of gait and mobility: Secondary | ICD-10-CM | POA: Diagnosis not present

## 2024-05-22 DIAGNOSIS — R1310 Dysphagia, unspecified: Secondary | ICD-10-CM | POA: Diagnosis not present

## 2024-05-22 DIAGNOSIS — R41841 Cognitive communication deficit: Secondary | ICD-10-CM | POA: Diagnosis not present

## 2024-05-23 DIAGNOSIS — R2689 Other abnormalities of gait and mobility: Secondary | ICD-10-CM | POA: Diagnosis not present

## 2024-05-23 DIAGNOSIS — R41841 Cognitive communication deficit: Secondary | ICD-10-CM | POA: Diagnosis not present

## 2024-05-23 DIAGNOSIS — R1310 Dysphagia, unspecified: Secondary | ICD-10-CM | POA: Diagnosis not present

## 2024-05-23 DIAGNOSIS — F0390 Unspecified dementia without behavioral disturbance: Secondary | ICD-10-CM | POA: Diagnosis not present

## 2024-05-24 DIAGNOSIS — R1310 Dysphagia, unspecified: Secondary | ICD-10-CM | POA: Diagnosis not present

## 2024-05-24 DIAGNOSIS — F0390 Unspecified dementia without behavioral disturbance: Secondary | ICD-10-CM | POA: Diagnosis not present

## 2024-05-24 DIAGNOSIS — R41841 Cognitive communication deficit: Secondary | ICD-10-CM | POA: Diagnosis not present

## 2024-05-24 DIAGNOSIS — R2689 Other abnormalities of gait and mobility: Secondary | ICD-10-CM | POA: Diagnosis not present

## 2024-05-26 DIAGNOSIS — F0390 Unspecified dementia without behavioral disturbance: Secondary | ICD-10-CM | POA: Diagnosis not present

## 2024-05-26 DIAGNOSIS — R2689 Other abnormalities of gait and mobility: Secondary | ICD-10-CM | POA: Diagnosis not present

## 2024-05-26 DIAGNOSIS — R41841 Cognitive communication deficit: Secondary | ICD-10-CM | POA: Diagnosis not present

## 2024-05-26 DIAGNOSIS — R1310 Dysphagia, unspecified: Secondary | ICD-10-CM | POA: Diagnosis not present

## 2024-05-27 DIAGNOSIS — R1310 Dysphagia, unspecified: Secondary | ICD-10-CM | POA: Diagnosis not present

## 2024-05-27 DIAGNOSIS — R2689 Other abnormalities of gait and mobility: Secondary | ICD-10-CM | POA: Diagnosis not present

## 2024-05-27 DIAGNOSIS — R41841 Cognitive communication deficit: Secondary | ICD-10-CM | POA: Diagnosis not present

## 2024-05-27 DIAGNOSIS — F0390 Unspecified dementia without behavioral disturbance: Secondary | ICD-10-CM | POA: Diagnosis not present

## 2024-05-28 DIAGNOSIS — F0390 Unspecified dementia without behavioral disturbance: Secondary | ICD-10-CM | POA: Diagnosis not present

## 2024-05-28 DIAGNOSIS — R2689 Other abnormalities of gait and mobility: Secondary | ICD-10-CM | POA: Diagnosis not present

## 2024-05-28 DIAGNOSIS — R1310 Dysphagia, unspecified: Secondary | ICD-10-CM | POA: Diagnosis not present

## 2024-05-28 DIAGNOSIS — R41841 Cognitive communication deficit: Secondary | ICD-10-CM | POA: Diagnosis not present

## 2024-05-29 DIAGNOSIS — R2689 Other abnormalities of gait and mobility: Secondary | ICD-10-CM | POA: Diagnosis not present

## 2024-05-29 DIAGNOSIS — R1310 Dysphagia, unspecified: Secondary | ICD-10-CM | POA: Diagnosis not present

## 2024-05-29 DIAGNOSIS — F0390 Unspecified dementia without behavioral disturbance: Secondary | ICD-10-CM | POA: Diagnosis not present

## 2024-05-29 DIAGNOSIS — R41841 Cognitive communication deficit: Secondary | ICD-10-CM | POA: Diagnosis not present

## 2024-05-30 DIAGNOSIS — F0390 Unspecified dementia without behavioral disturbance: Secondary | ICD-10-CM | POA: Diagnosis not present

## 2024-05-30 DIAGNOSIS — R41841 Cognitive communication deficit: Secondary | ICD-10-CM | POA: Diagnosis not present

## 2024-05-30 DIAGNOSIS — R1310 Dysphagia, unspecified: Secondary | ICD-10-CM | POA: Diagnosis not present

## 2024-06-02 DIAGNOSIS — F0390 Unspecified dementia without behavioral disturbance: Secondary | ICD-10-CM | POA: Diagnosis not present

## 2024-06-02 DIAGNOSIS — R41841 Cognitive communication deficit: Secondary | ICD-10-CM | POA: Diagnosis not present

## 2024-06-02 DIAGNOSIS — R1310 Dysphagia, unspecified: Secondary | ICD-10-CM | POA: Diagnosis not present

## 2024-06-03 DIAGNOSIS — R41841 Cognitive communication deficit: Secondary | ICD-10-CM | POA: Diagnosis not present

## 2024-06-03 DIAGNOSIS — F0390 Unspecified dementia without behavioral disturbance: Secondary | ICD-10-CM | POA: Diagnosis not present

## 2024-06-03 DIAGNOSIS — R1310 Dysphagia, unspecified: Secondary | ICD-10-CM | POA: Diagnosis not present

## 2024-06-04 DIAGNOSIS — R1310 Dysphagia, unspecified: Secondary | ICD-10-CM | POA: Diagnosis not present

## 2024-06-04 DIAGNOSIS — R41841 Cognitive communication deficit: Secondary | ICD-10-CM | POA: Diagnosis not present

## 2024-06-04 DIAGNOSIS — F0390 Unspecified dementia without behavioral disturbance: Secondary | ICD-10-CM | POA: Diagnosis not present

## 2024-06-05 DIAGNOSIS — R41841 Cognitive communication deficit: Secondary | ICD-10-CM | POA: Diagnosis not present

## 2024-06-05 DIAGNOSIS — R1310 Dysphagia, unspecified: Secondary | ICD-10-CM | POA: Diagnosis not present

## 2024-06-05 DIAGNOSIS — F0390 Unspecified dementia without behavioral disturbance: Secondary | ICD-10-CM | POA: Diagnosis not present

## 2024-06-06 DIAGNOSIS — R41841 Cognitive communication deficit: Secondary | ICD-10-CM | POA: Diagnosis not present

## 2024-06-06 DIAGNOSIS — F0390 Unspecified dementia without behavioral disturbance: Secondary | ICD-10-CM | POA: Diagnosis not present

## 2024-06-06 DIAGNOSIS — R1310 Dysphagia, unspecified: Secondary | ICD-10-CM | POA: Diagnosis not present

## 2024-06-09 DIAGNOSIS — R1310 Dysphagia, unspecified: Secondary | ICD-10-CM | POA: Diagnosis not present

## 2024-06-09 DIAGNOSIS — R41841 Cognitive communication deficit: Secondary | ICD-10-CM | POA: Diagnosis not present

## 2024-06-09 DIAGNOSIS — F0390 Unspecified dementia without behavioral disturbance: Secondary | ICD-10-CM | POA: Diagnosis not present

## 2024-06-10 DIAGNOSIS — R1310 Dysphagia, unspecified: Secondary | ICD-10-CM | POA: Diagnosis not present

## 2024-06-10 DIAGNOSIS — F0390 Unspecified dementia without behavioral disturbance: Secondary | ICD-10-CM | POA: Diagnosis not present

## 2024-06-10 DIAGNOSIS — R41841 Cognitive communication deficit: Secondary | ICD-10-CM | POA: Diagnosis not present

## 2024-06-11 DIAGNOSIS — R41841 Cognitive communication deficit: Secondary | ICD-10-CM | POA: Diagnosis not present

## 2024-06-11 DIAGNOSIS — F0390 Unspecified dementia without behavioral disturbance: Secondary | ICD-10-CM | POA: Diagnosis not present

## 2024-06-11 DIAGNOSIS — R1310 Dysphagia, unspecified: Secondary | ICD-10-CM | POA: Diagnosis not present

## 2024-06-12 DIAGNOSIS — R41841 Cognitive communication deficit: Secondary | ICD-10-CM | POA: Diagnosis not present

## 2024-06-12 DIAGNOSIS — R1310 Dysphagia, unspecified: Secondary | ICD-10-CM | POA: Diagnosis not present

## 2024-06-12 DIAGNOSIS — F0390 Unspecified dementia without behavioral disturbance: Secondary | ICD-10-CM | POA: Diagnosis not present

## 2024-06-13 DIAGNOSIS — F0390 Unspecified dementia without behavioral disturbance: Secondary | ICD-10-CM | POA: Diagnosis not present

## 2024-06-13 DIAGNOSIS — R41841 Cognitive communication deficit: Secondary | ICD-10-CM | POA: Diagnosis not present

## 2024-06-13 DIAGNOSIS — R1310 Dysphagia, unspecified: Secondary | ICD-10-CM | POA: Diagnosis not present

## 2024-06-16 DIAGNOSIS — F0390 Unspecified dementia without behavioral disturbance: Secondary | ICD-10-CM | POA: Diagnosis not present

## 2024-06-16 DIAGNOSIS — R41841 Cognitive communication deficit: Secondary | ICD-10-CM | POA: Diagnosis not present

## 2024-06-16 DIAGNOSIS — R1310 Dysphagia, unspecified: Secondary | ICD-10-CM | POA: Diagnosis not present
